# Patient Record
Sex: Female | Born: 1955 | Race: White | Hispanic: No | Marital: Married | Smoking: Never smoker
Health system: Southern US, Community
[De-identification: ages and names within clinical notes are randomized; demographics above are authoritative.]

## PROBLEM LIST (undated history)

## (undated) DIAGNOSIS — T4145XA Adverse effect of unspecified anesthetic, initial encounter: Secondary | ICD-10-CM

## (undated) DIAGNOSIS — M179 Osteoarthritis of knee, unspecified: Secondary | ICD-10-CM

## (undated) DIAGNOSIS — E785 Hyperlipidemia, unspecified: Secondary | ICD-10-CM

## (undated) DIAGNOSIS — M1990 Unspecified osteoarthritis, unspecified site: Secondary | ICD-10-CM

## (undated) DIAGNOSIS — Z8669 Personal history of other diseases of the nervous system and sense organs: Secondary | ICD-10-CM

## (undated) DIAGNOSIS — K219 Gastro-esophageal reflux disease without esophagitis: Secondary | ICD-10-CM

## (undated) DIAGNOSIS — Z8489 Family history of other specified conditions: Secondary | ICD-10-CM

## (undated) DIAGNOSIS — I82409 Acute embolism and thrombosis of unspecified deep veins of unspecified lower extremity: Secondary | ICD-10-CM

## (undated) DIAGNOSIS — J45909 Unspecified asthma, uncomplicated: Secondary | ICD-10-CM

## (undated) DIAGNOSIS — E039 Hypothyroidism, unspecified: Secondary | ICD-10-CM

## (undated) DIAGNOSIS — R739 Hyperglycemia, unspecified: Secondary | ICD-10-CM

## (undated) DIAGNOSIS — F32A Depression, unspecified: Secondary | ICD-10-CM

## (undated) DIAGNOSIS — M5440 Lumbago with sciatica, unspecified side: Secondary | ICD-10-CM

## (undated) DIAGNOSIS — R32 Unspecified urinary incontinence: Secondary | ICD-10-CM

## (undated) DIAGNOSIS — R160 Hepatomegaly, not elsewhere classified: Secondary | ICD-10-CM

## (undated) DIAGNOSIS — I10 Essential (primary) hypertension: Secondary | ICD-10-CM

## (undated) DIAGNOSIS — R632 Polyphagia: Secondary | ICD-10-CM

## (undated) DIAGNOSIS — K760 Fatty (change of) liver, not elsewhere classified: Secondary | ICD-10-CM

## (undated) DIAGNOSIS — M1710 Unilateral primary osteoarthritis, unspecified knee: Secondary | ICD-10-CM

## (undated) DIAGNOSIS — G4730 Sleep apnea, unspecified: Secondary | ICD-10-CM

## (undated) HISTORY — DX: Gastro-esophageal reflux disease without esophagitis: K21.9

## (undated) HISTORY — PX: OTHER SURGICAL HISTORY: SHX169

## (undated) HISTORY — DX: Polyphagia: R63.2

## (undated) HISTORY — DX: Unilateral primary osteoarthritis, unspecified knee: M17.10

## (undated) HISTORY — DX: Acute embolism and thrombosis of unspecified deep veins of unspecified lower extremity: I82.409

## (undated) HISTORY — DX: Lumbago with sciatica, unspecified side: M54.40

## (undated) HISTORY — DX: Fatty (change of) liver, not elsewhere classified: K76.0

## (undated) HISTORY — DX: Hyperglycemia, unspecified: R73.9

## (undated) HISTORY — DX: Depression, unspecified: F32.A

## (undated) HISTORY — DX: Hepatomegaly, not elsewhere classified: R16.0

## (undated) HISTORY — DX: Hyperlipidemia, unspecified: E78.5

## (undated) HISTORY — DX: Personal history of other diseases of the nervous system and sense organs: Z86.69

## (undated) HISTORY — PX: CHOLECYSTECTOMY: SHX55

## (undated) HISTORY — DX: Hypercalcemia: E83.52

## (undated) HISTORY — DX: Osteoarthritis of knee, unspecified: M17.9

---

## 2004-04-19 ENCOUNTER — Ambulatory Visit: Payer: Self-pay | Admitting: Family Medicine

## 2004-04-20 ENCOUNTER — Inpatient Hospital Stay: Payer: Self-pay | Admitting: Internal Medicine

## 2004-06-15 ENCOUNTER — Ambulatory Visit (HOSPITAL_COMMUNITY): Admission: RE | Admit: 2004-06-15 | Discharge: 2004-06-15 | Payer: Self-pay | Admitting: General Surgery

## 2004-06-23 ENCOUNTER — Ambulatory Visit: Payer: Self-pay | Admitting: Family Medicine

## 2004-06-23 ENCOUNTER — Emergency Department: Payer: Self-pay | Admitting: Emergency Medicine

## 2004-07-05 ENCOUNTER — Ambulatory Visit (HOSPITAL_COMMUNITY): Admission: RE | Admit: 2004-07-05 | Discharge: 2004-07-05 | Payer: Self-pay | Admitting: Orthopedic Surgery

## 2007-03-24 ENCOUNTER — Ambulatory Visit: Payer: Self-pay | Admitting: Unknown Physician Specialty

## 2007-03-30 ENCOUNTER — Ambulatory Visit: Payer: Self-pay | Admitting: Unknown Physician Specialty

## 2008-07-28 ENCOUNTER — Ambulatory Visit: Payer: Self-pay | Admitting: Urology

## 2008-08-23 ENCOUNTER — Ambulatory Visit: Payer: Self-pay | Admitting: Unknown Physician Specialty

## 2008-09-21 ENCOUNTER — Ambulatory Visit: Payer: Self-pay | Admitting: Unknown Physician Specialty

## 2008-09-22 ENCOUNTER — Ambulatory Visit: Payer: Self-pay | Admitting: Unknown Physician Specialty

## 2011-12-19 ENCOUNTER — Other Ambulatory Visit (HOSPITAL_COMMUNITY): Payer: Self-pay | Admitting: Orthopedic Surgery

## 2012-02-10 ENCOUNTER — Encounter (HOSPITAL_COMMUNITY)
Admission: RE | Admit: 2012-02-10 | Discharge: 2012-02-10 | Disposition: A | Discharge status: Home / Self Care | Payer: BC Managed Care – PPO | Source: Ambulatory Visit | Attending: Orthopedic Surgery | Admitting: Orthopedic Surgery

## 2012-02-10 ENCOUNTER — Encounter (HOSPITAL_COMMUNITY): Payer: Self-pay

## 2012-02-10 DIAGNOSIS — Z01812 Encounter for preprocedural laboratory examination: Secondary | ICD-10-CM | POA: Insufficient documentation

## 2012-02-10 DIAGNOSIS — Z01818 Encounter for other preprocedural examination: Secondary | ICD-10-CM | POA: Insufficient documentation

## 2012-02-10 DIAGNOSIS — Z01810 Encounter for preprocedural cardiovascular examination: Secondary | ICD-10-CM | POA: Insufficient documentation

## 2012-02-10 HISTORY — DX: Sleep apnea, unspecified: G47.30

## 2012-02-10 HISTORY — DX: Hypothyroidism, unspecified: E03.9

## 2012-02-10 HISTORY — DX: Unspecified asthma, uncomplicated: J45.909

## 2012-02-10 HISTORY — DX: Unspecified osteoarthritis, unspecified site: M19.90

## 2012-02-10 LAB — BASIC METABOLIC PANEL
Creatinine, Ser: 0.83 mg/dL (ref 0.50–1.10)
GFR calc Af Amer: 88 mL/min — ABNORMAL LOW (ref >90)
GFR calc non Af Amer: 76 mL/min — ABNORMAL LOW (ref >90)
Potassium: 4.2 mEq/L (ref 3.5–5.1)

## 2012-02-10 LAB — URINE MICROSCOPIC-ADD ON

## 2012-02-10 LAB — URINALYSIS, ROUTINE W REFLEX MICROSCOPIC
Ketones, ur: NEGATIVE mg/dL
Nitrite: POSITIVE — AB
Protein, ur: NEGATIVE mg/dL

## 2012-02-10 LAB — CBC
HCT: 40.1 % (ref 36.0–46.0)
Hemoglobin: 13.4 g/dL (ref 12.0–15.0)
MCH: 29.6 pg (ref 26.0–34.0)
Platelets: 293 10*3/uL (ref 150–400)
RDW: 13.4 % (ref 11.5–15.5)
WBC: 8.1 10*3/uL (ref 4.0–10.5)

## 2012-02-10 LAB — TYPE AND SCREEN: ABO/RH(D): B POS

## 2012-02-10 LAB — SURGICAL PCR SCREEN: Staphylococcus aureus: NEGATIVE

## 2012-02-10 NOTE — Pre-Procedure Instructions (Signed)
20 Mercedes Carter  02/10/2012   Your procedure is scheduled on:  02-18-2012  Report to [facility] at 5:30 AM.  Call this number if you have problems the morning of surgery: [phone]   Remember:   Do not eat food or drink:After Midnight..    Take these medicines the morning of surgery with A SIP OF WATER:    Do not wear jewelry, make-up or nail polish.  Do not wear lotions, powders, or perfumes. You may not wear deodorant.  Do not shave 48 hours prior to surgery. .  Do not bring valuables to the hospital.  Contacts, dentures or bridgework may not be worn into surgery.  Leave suitcase in the car. After surgery it may be brought to your room.    For patients admitted to the hospital, checkout time is 11:00 AM the day of discharge.  Marland Kitchen   Special Instructions: Shower using CHG 2 nights before surgery and the night before surgery.  If you shower the day of surgery use CHG.  Use special wash - you have one bottle of CHG for all showers.  You should use approximately 1/3 of the bottle for each shower.     Please read over the following fact sheets that you were given: Pain Booklet, Coughing and Deep Breathing, Blood Transfusion Information, MRSA Information and Surgical Site Infection Prevention

## 2012-02-10 NOTE — Progress Notes (Signed)
Sleep study requested from [facility] ,[place], [place].

## 2012-02-10 NOTE — Progress Notes (Signed)
Need to have a release form sighned to get sleep study from [facility]

## 2012-02-13 LAB — URINE CULTURE

## 2012-02-17 MED ORDER — DEXTROSE 5 % IV SOLN
3.0000 g | INTRAVENOUS | Status: AC
  Administered 2012-02-18: 3 g via INTRAVENOUS
  Filled 2012-02-17: qty 3000

## 2012-02-18 ENCOUNTER — Encounter (HOSPITAL_COMMUNITY): Payer: Self-pay

## 2012-02-18 ENCOUNTER — Inpatient Hospital Stay (HOSPITAL_COMMUNITY): Payer: BC Managed Care – PPO

## 2012-02-18 ENCOUNTER — Inpatient Hospital Stay (HOSPITAL_COMMUNITY): Payer: BC Managed Care – PPO | Admitting: Anesthesiology

## 2012-02-18 ENCOUNTER — Encounter (HOSPITAL_COMMUNITY): Payer: Self-pay | Admitting: Anesthesiology

## 2012-02-18 ENCOUNTER — Encounter (HOSPITAL_COMMUNITY)
Admission: RE | Disposition: A | Discharge status: Home Health | Payer: Self-pay | Source: Ambulatory Visit | Attending: Orthopedic Surgery

## 2012-02-18 ENCOUNTER — Inpatient Hospital Stay (HOSPITAL_COMMUNITY)
Admission: RE | Admit: 2012-02-18 | Discharge: 2012-02-23 | DRG: 209 | Disposition: A | Discharge status: Home Health | Payer: BC Managed Care – PPO | Source: Ambulatory Visit | Attending: Orthopedic Surgery | Admitting: Orthopedic Surgery

## 2012-02-18 DIAGNOSIS — E039 Hypothyroidism, unspecified: Secondary | ICD-10-CM | POA: Diagnosis present

## 2012-02-18 DIAGNOSIS — J45909 Unspecified asthma, uncomplicated: Secondary | ICD-10-CM | POA: Diagnosis present

## 2012-02-18 DIAGNOSIS — R0689 Other abnormalities of breathing: Secondary | ICD-10-CM

## 2012-02-18 DIAGNOSIS — G4733 Obstructive sleep apnea (adult) (pediatric): Secondary | ICD-10-CM | POA: Diagnosis present

## 2012-02-18 DIAGNOSIS — J9601 Acute respiratory failure with hypoxia: Secondary | ICD-10-CM

## 2012-02-18 DIAGNOSIS — M1710 Unilateral primary osteoarthritis, unspecified knee: Principal | ICD-10-CM | POA: Diagnosis present

## 2012-02-18 DIAGNOSIS — R4182 Altered mental status, unspecified: Secondary | ICD-10-CM

## 2012-02-18 DIAGNOSIS — M129 Arthropathy, unspecified: Secondary | ICD-10-CM | POA: Diagnosis present

## 2012-02-18 DIAGNOSIS — Z91199 Patient's noncompliance with other medical treatment and regimen due to unspecified reason: Secondary | ICD-10-CM

## 2012-02-18 DIAGNOSIS — T8859XA Other complications of anesthesia, initial encounter: Secondary | ICD-10-CM | POA: Insufficient documentation

## 2012-02-18 DIAGNOSIS — Z6841 Body Mass Index (BMI) 40.0 and over, adult: Secondary | ICD-10-CM

## 2012-02-18 DIAGNOSIS — E669 Obesity, unspecified: Secondary | ICD-10-CM | POA: Diagnosis present

## 2012-02-18 DIAGNOSIS — Z9119 Patient's noncompliance with other medical treatment and regimen: Secondary | ICD-10-CM

## 2012-02-18 DIAGNOSIS — J95821 Acute postprocedural respiratory failure: Secondary | ICD-10-CM

## 2012-02-18 DIAGNOSIS — R0609 Other forms of dyspnea: Secondary | ICD-10-CM

## 2012-02-18 DIAGNOSIS — I10 Essential (primary) hypertension: Secondary | ICD-10-CM | POA: Diagnosis present

## 2012-02-18 DIAGNOSIS — Z96659 Presence of unspecified artificial knee joint: Secondary | ICD-10-CM

## 2012-02-18 DIAGNOSIS — K219 Gastro-esophageal reflux disease without esophagitis: Secondary | ICD-10-CM | POA: Diagnosis present

## 2012-02-18 HISTORY — PX: TOTAL KNEE ARTHROPLASTY: SHX125

## 2012-02-18 HISTORY — DX: Acute respiratory failure with hypoxia: J96.01

## 2012-02-18 HISTORY — DX: Family history of other specified conditions: Z84.89

## 2012-02-18 HISTORY — DX: Adverse effect of unspecified anesthetic, initial encounter: T41.45XA

## 2012-02-18 HISTORY — DX: Essential (primary) hypertension: I10

## 2012-02-18 HISTORY — DX: Presence of unspecified artificial knee joint: Z96.659

## 2012-02-18 HISTORY — DX: Obstructive sleep apnea (adult) (pediatric): G47.33

## 2012-02-18 HISTORY — DX: Acute postprocedural respiratory failure: J95.821

## 2012-02-18 HISTORY — DX: Other abnormalities of breathing: R06.89

## 2012-02-18 HISTORY — DX: Obesity, unspecified: E66.9

## 2012-02-18 HISTORY — DX: Other complications of anesthesia, initial encounter: T88.59XA

## 2012-02-18 HISTORY — DX: Unspecified urinary incontinence: R32

## 2012-02-18 HISTORY — DX: Altered mental status, unspecified: R41.82

## 2012-02-18 HISTORY — DX: Hypothyroidism, unspecified: E03.9

## 2012-02-18 LAB — CK TOTAL AND CKMB (NOT AT ARMC)
CK, MB: 6 ng/mL — ABNORMAL HIGH (ref 0.3–4.0)
Relative Index: 1.3 (ref 0.0–2.5)
Total CK: 470 U/L — ABNORMAL HIGH (ref 7–177)

## 2012-02-18 LAB — COMPREHENSIVE METABOLIC PANEL
AST: 65 U/L — ABNORMAL HIGH (ref 0–37)
Albumin: 3.5 g/dL (ref 3.5–5.2)
BUN: 16 mg/dL (ref 6–23)
Calcium: 8.7 mg/dL (ref 8.4–10.5)
Creatinine, Ser: 0.99 mg/dL (ref 0.50–1.10)
Total Bilirubin: 0.2 mg/dL — ABNORMAL LOW (ref 0.3–1.2)
Total Protein: 7 g/dL (ref 6.0–8.3)

## 2012-02-18 LAB — APTT: aPTT: 28 seconds (ref 24–37)

## 2012-02-18 LAB — PROTIME-INR: Prothrombin Time: 12.9 seconds (ref 11.6–15.2)

## 2012-02-18 LAB — GLUCOSE, CAPILLARY
Glucose-Capillary: 404 mg/dL — ABNORMAL HIGH (ref 70–99)
Glucose-Capillary: 357 mg/dL — ABNORMAL HIGH (ref 70–99)

## 2012-02-18 LAB — CBC
Hemoglobin: 11.6 g/dL — ABNORMAL LOW (ref 12.0–15.0)
MCH: 28.6 pg (ref 26.0–34.0)
Platelets: 325 10*3/uL (ref 150–400)
RBC: 4.05 MIL/uL (ref 3.87–5.11)
WBC: 19.9 10*3/uL — ABNORMAL HIGH (ref 4.0–10.5)

## 2012-02-18 LAB — BLOOD GAS, ARTERIAL
Acid-base deficit: 7.9 mmol/L — ABNORMAL HIGH (ref 0.0–2.0)
Drawn by: 277331
O2 Content: 5 L/min
pCO2 arterial: 43.6 mmHg (ref 35.0–45.0)
pH, Arterial: 7.243 — ABNORMAL LOW (ref 7.350–7.450)
pO2, Arterial: 152 mmHg — ABNORMAL HIGH (ref 80.0–100.0)

## 2012-02-18 LAB — LACTIC ACID, PLASMA: Lactic Acid, Venous: 5.3 mmol/L — ABNORMAL HIGH (ref 0.5–2.2)

## 2012-02-18 SURGERY — ARTHROPLASTY, KNEE, TOTAL
Anesthesia: General | Site: Knee | Laterality: Right | Wound class: Clean

## 2012-02-18 MED ORDER — CLONIDINE HCL (ANALGESIA) 100 MCG/ML EP SOLN
EPIDURAL | Status: DC | PRN
  Administered 2012-02-18: 1 mL

## 2012-02-18 MED ORDER — GLYCOPYRROLATE 0.2 MG/ML IJ SOLN
INTRAMUSCULAR | Status: DC | PRN
  Administered 2012-02-18: 0.6 mg via INTRAVENOUS

## 2012-02-18 MED ORDER — METHOCARBAMOL 100 MG/ML IJ SOLN
500.0000 mg | Freq: Four times a day (QID) | INTRAVENOUS | Status: DC | PRN
  Administered 2012-02-18: 500 mg via INTRAVENOUS
  Filled 2012-02-18: qty 5

## 2012-02-18 MED ORDER — VECURONIUM BROMIDE 10 MG IV SOLR
INTRAVENOUS | Status: DC | PRN
  Administered 2012-02-18: 1 mg via INTRAVENOUS
  Administered 2012-02-18: 2 mg via INTRAVENOUS
  Administered 2012-02-18: 1 mg via INTRAVENOUS
  Administered 2012-02-18: 5 mg via INTRAVENOUS

## 2012-02-18 MED ORDER — BUPIVACAINE-EPINEPHRINE (PF) 0.5% -1:200000 IJ SOLN
INTRAMUSCULAR | Status: AC
  Filled 2012-02-18: qty 10

## 2012-02-18 MED ORDER — WARFARIN SODIUM 7.5 MG PO TABS
7.5000 mg | ORAL_TABLET | Freq: Once | ORAL | Status: AC
  Administered 2012-02-18: 7.5 mg via ORAL
  Filled 2012-02-18 (×2): qty 1

## 2012-02-18 MED ORDER — BUPIVACAINE-EPINEPHRINE 0.5% -1:200000 IJ SOLN
INTRAMUSCULAR | Status: DC | PRN
  Administered 2012-02-18: 30 mL

## 2012-02-18 MED ORDER — ONDANSETRON HCL 4 MG/2ML IJ SOLN
INTRAMUSCULAR | Status: DC | PRN
  Administered 2012-02-18: 4 mg via INTRAVENOUS

## 2012-02-18 MED ORDER — PHENOL 1.4 % MT LIQD: 1.0000 | OROMUCOSAL | Status: DC | PRN

## 2012-02-18 MED ORDER — NALOXONE HCL 0.4 MG/ML IJ SOLN
0.4000 mg | INTRAMUSCULAR | Status: DC | PRN
  Administered 2012-02-18 (×2): 0.2 mg via INTRAVENOUS
  Filled 2012-02-18 (×3): qty 1

## 2012-02-18 MED ORDER — MEPERIDINE HCL 25 MG/ML IJ SOLN: 6.2500 mg | INTRAMUSCULAR | Status: DC | PRN

## 2012-02-18 MED ORDER — KETOROLAC TROMETHAMINE 30 MG/ML IJ SOLN
15.0000 mg | Freq: Once | INTRAMUSCULAR | Status: AC | PRN
  Administered 2012-02-18: 30 mg via INTRAVENOUS

## 2012-02-18 MED ORDER — KETOROLAC TROMETHAMINE 30 MG/ML IJ SOLN
INTRAMUSCULAR | Status: AC
  Filled 2012-02-18: qty 1

## 2012-02-18 MED ORDER — ACETAMINOPHEN 10 MG/ML IV SOLN
1000.0000 mg | Freq: Once | INTRAVENOUS | Status: AC
  Administered 2012-02-18: 1000 mg via INTRAVENOUS
  Filled 2012-02-18: qty 100

## 2012-02-18 MED ORDER — OXYCODONE HCL 5 MG PO TABS: 5.0000 mg | ORAL_TABLET | Freq: Once | ORAL | Status: DC | PRN

## 2012-02-18 MED ORDER — ONDANSETRON HCL 4 MG PO TABS: 4.0000 mg | ORAL_TABLET | Freq: Four times a day (QID) | ORAL | Status: DC | PRN

## 2012-02-18 MED ORDER — HYDROMORPHONE HCL PF 1 MG/ML IJ SOLN
0.2500 mg | INTRAMUSCULAR | Status: DC | PRN
  Administered 2012-02-18 (×4): 0.5 mg via INTRAVENOUS

## 2012-02-18 MED ORDER — LEVOTHYROXINE SODIUM 175 MCG PO TABS
175.0000 ug | ORAL_TABLET | Freq: Every day | ORAL | Status: DC
  Administered 2012-02-19 – 2012-02-23 (×5): 175 ug via ORAL
  Filled 2012-02-18 (×8): qty 1

## 2012-02-18 MED ORDER — METHOCARBAMOL 500 MG PO TABS
500.0000 mg | ORAL_TABLET | Freq: Four times a day (QID) | ORAL | Status: DC | PRN
  Administered 2012-02-19 – 2012-02-22 (×6): 500 mg via ORAL
  Filled 2012-02-18 (×6): qty 1

## 2012-02-18 MED ORDER — HYDROMORPHONE HCL PF 1 MG/ML IJ SOLN
INTRAMUSCULAR | Status: AC
  Administered 2012-02-18: 0.5 mg via INTRAVENOUS
  Filled 2012-02-18: qty 1

## 2012-02-18 MED ORDER — MORPHINE SULFATE 2 MG/ML IJ SOLN
0.5000 mg | INTRAMUSCULAR | Status: DC | PRN
  Administered 2012-02-19 (×4): 0.5 mg via INTRAVENOUS
  Filled 2012-02-18 (×4): qty 1

## 2012-02-18 MED ORDER — ACETAMINOPHEN 10 MG/ML IV SOLN
INTRAVENOUS | Status: AC
  Filled 2012-02-18: qty 100

## 2012-02-18 MED ORDER — MORPHINE SULFATE (PF) 1 MG/ML IV SOLN
INTRAVENOUS | Status: DC
  Administered 2012-02-18: 11:00:00 via INTRAVENOUS

## 2012-02-18 MED ORDER — HYDROMORPHONE HCL PF 1 MG/ML IJ SOLN
INTRAMUSCULAR | Status: AC
  Filled 2012-02-18: qty 1

## 2012-02-18 MED ORDER — PROPOFOL 10 MG/ML IV BOLUS
INTRAVENOUS | Status: DC | PRN
  Administered 2012-02-18: 200 mg via INTRAVENOUS

## 2012-02-18 MED ORDER — DOCUSATE SODIUM 100 MG PO CAPS
100.0000 mg | ORAL_CAPSULE | Freq: Two times a day (BID) | ORAL | Status: DC
  Administered 2012-02-19 – 2012-02-23 (×8): 100 mg via ORAL
  Filled 2012-02-18 (×12): qty 1

## 2012-02-18 MED ORDER — WARFARIN VIDEO: Freq: Once | Status: DC

## 2012-02-18 MED ORDER — ALBUTEROL SULFATE HFA 108 (90 BASE) MCG/ACT IN AERS: 2.0000 | INHALATION_SPRAY | Freq: Four times a day (QID) | RESPIRATORY_TRACT | Status: DC | PRN

## 2012-02-18 MED ORDER — DIPHENHYDRAMINE HCL 50 MG/ML IJ SOLN
12.5000 mg | Freq: Four times a day (QID) | INTRAMUSCULAR | Status: DC | PRN
  Administered 2012-02-18: 12.5 mg via INTRAVENOUS
  Filled 2012-02-18: qty 1

## 2012-02-18 MED ORDER — ONDANSETRON HCL 4 MG/2ML IJ SOLN: 4.0000 mg | Freq: Four times a day (QID) | INTRAMUSCULAR | Status: DC | PRN

## 2012-02-18 MED ORDER — ARTIFICIAL TEARS OP OINT
TOPICAL_OINTMENT | OPHTHALMIC | Status: DC | PRN
  Administered 2012-02-18: 1 via OPHTHALMIC

## 2012-02-18 MED ORDER — METOCLOPRAMIDE HCL 10 MG PO TABS: 5.0000 mg | ORAL_TABLET | Freq: Three times a day (TID) | ORAL | Status: DC | PRN

## 2012-02-18 MED ORDER — FENTANYL CITRATE 0.05 MG/ML IJ SOLN
INTRAMUSCULAR | Status: DC | PRN
  Administered 2012-02-18: 50 ug via INTRAVENOUS
  Administered 2012-02-18: 100 ug via INTRAVENOUS
  Administered 2012-02-18 (×7): 50 ug via INTRAVENOUS

## 2012-02-18 MED ORDER — LACTATED RINGERS IV SOLN
INTRAVENOUS | Status: DC | PRN
  Administered 2012-02-18 (×2): via INTRAVENOUS

## 2012-02-18 MED ORDER — CLONIDINE HCL (ANALGESIA) 100 MCG/ML EP SOLN
150.0000 ug | Freq: Once | EPIDURAL | Status: DC
  Filled 2012-02-18: qty 1.5

## 2012-02-18 MED ORDER — OXYCODONE HCL 5 MG/5ML PO SOLN: 5.0000 mg | Freq: Once | ORAL | Status: DC | PRN

## 2012-02-18 MED ORDER — NEOSTIGMINE METHYLSULFATE 1 MG/ML IJ SOLN
INTRAMUSCULAR | Status: DC | PRN
  Administered 2012-02-18: 5 mg via INTRAVENOUS

## 2012-02-18 MED ORDER — MORPHINE SULFATE 4 MG/ML IJ SOLN
INTRAMUSCULAR | Status: AC
  Filled 2012-02-18: qty 1

## 2012-02-18 MED ORDER — SUCCINYLCHOLINE CHLORIDE 20 MG/ML IJ SOLN
INTRAMUSCULAR | Status: DC | PRN
  Administered 2012-02-18: 100 mg via INTRAVENOUS

## 2012-02-18 MED ORDER — LIDOCAINE HCL (CARDIAC) 20 MG/ML IV SOLN
INTRAVENOUS | Status: DC | PRN
  Administered 2012-02-18: 70 mg via INTRAVENOUS

## 2012-02-18 MED ORDER — COUMADIN BOOK
Freq: Once | Status: DC
  Filled 2012-02-18: qty 1

## 2012-02-18 MED ORDER — ACETAMINOPHEN 325 MG PO TABS
650.0000 mg | ORAL_TABLET | Freq: Four times a day (QID) | ORAL | Status: DC | PRN
  Administered 2012-02-19 – 2012-02-22 (×5): 650 mg via ORAL
  Administered 2012-02-22: 325 mg via ORAL
  Filled 2012-02-18 (×3): qty 2
  Filled 2012-02-18: qty 1
  Filled 2012-02-18 (×3): qty 2

## 2012-02-18 MED ORDER — SODIUM CHLORIDE 0.9 % IJ SOLN: 9.0000 mL | INTRAMUSCULAR | Status: DC | PRN

## 2012-02-18 MED ORDER — ACETAMINOPHEN 650 MG RE SUPP: 650.0000 mg | Freq: Four times a day (QID) | RECTAL | Status: DC | PRN

## 2012-02-18 MED ORDER — METOCLOPRAMIDE HCL 5 MG/ML IJ SOLN
5.0000 mg | Freq: Three times a day (TID) | INTRAMUSCULAR | Status: DC | PRN
Start: 2012-02-18 — End: 2012-02-23
  Filled 2012-02-18 (×2): qty 2

## 2012-02-18 MED ORDER — MORPHINE SULFATE 4 MG/ML IJ SOLN
INTRAMUSCULAR | Status: DC | PRN
  Administered 2012-02-18: 8 mg via INTRAMUSCULAR

## 2012-02-18 MED ORDER — LABETALOL HCL 5 MG/ML IV SOLN
INTRAVENOUS | Status: DC | PRN
  Administered 2012-02-18: 5 mg via INTRAVENOUS

## 2012-02-18 MED ORDER — CEFAZOLIN SODIUM 1-5 GM-% IV SOLN
1.0000 g | Freq: Three times a day (TID) | INTRAVENOUS | Status: AC
  Administered 2012-02-19: 1 g via INTRAVENOUS
  Filled 2012-02-18 (×4): qty 50

## 2012-02-18 MED ORDER — PROMETHAZINE HCL 25 MG/ML IJ SOLN: 6.2500 mg | INTRAMUSCULAR | Status: DC | PRN

## 2012-02-18 MED ORDER — MORPHINE SULFATE 2 MG/ML IJ SOLN
INTRAMUSCULAR | Status: AC
  Filled 2012-02-18: qty 2

## 2012-02-18 MED ORDER — MENTHOL 3 MG MT LOZG: 1.0000 | LOZENGE | OROMUCOSAL | Status: DC | PRN

## 2012-02-18 MED ORDER — MIDAZOLAM HCL 5 MG/5ML IJ SOLN
INTRAMUSCULAR | Status: DC | PRN
  Administered 2012-02-18: 2 mg via INTRAVENOUS

## 2012-02-18 MED ORDER — MORPHINE SULFATE (PF) 1 MG/ML IV SOLN
INTRAVENOUS | Status: AC
  Filled 2012-02-18: qty 25

## 2012-02-18 MED ORDER — DIPHENHYDRAMINE HCL 12.5 MG/5ML PO ELIX
12.5000 mg | ORAL_SOLUTION | Freq: Four times a day (QID) | ORAL | Status: DC | PRN
  Filled 2012-02-18: qty 5

## 2012-02-18 MED ORDER — WARFARIN - PHARMACIST DOSING INPATIENT
Freq: Every day | Status: DC
  Administered 2012-02-18: 20:00:00

## 2012-02-18 MED ORDER — POTASSIUM CHLORIDE IN NACL 20-0.45 MEQ/L-% IV SOLN
INTRAVENOUS | Status: DC
  Administered 2012-02-18: 19:00:00 via INTRAVENOUS
  Filled 2012-02-18 (×5): qty 1000

## 2012-02-18 MED ORDER — HYDROCODONE-ACETAMINOPHEN 7.5-325 MG PO TABS
1.0000 | ORAL_TABLET | Freq: Four times a day (QID) | ORAL | Status: DC
  Administered 2012-02-19 – 2012-02-23 (×15): 1 via ORAL
  Filled 2012-02-18 (×15): qty 1

## 2012-02-18 MED ORDER — POTASSIUM CHLORIDE IN NACL 20-0.9 MEQ/L-% IV SOLN
INTRAVENOUS | Status: DC
  Filled 2012-02-18 (×2): qty 1000

## 2012-02-18 SURGICAL SUPPLY — 79 items
BANDAGE ELASTIC 4 VELCRO ST LF (GAUZE/BANDAGES/DRESSINGS) ×2 IMPLANT
BANDAGE ELASTIC 6 VELCRO ST LF (GAUZE/BANDAGES/DRESSINGS) ×1 IMPLANT
BANDAGE ESMARK 6X9 LF (GAUZE/BANDAGES/DRESSINGS) ×1 IMPLANT
BLADE SAG 18X100X1.27 (BLADE) ×2 IMPLANT
BLADE SAW SGTL 13.0X1.19X90.0M (BLADE) ×2 IMPLANT
BNDG CMPR 9X6 STRL LF SNTH (GAUZE/BANDAGES/DRESSINGS) ×1
BNDG CMPR MED 10X6 ELC LF (GAUZE/BANDAGES/DRESSINGS) ×1
BNDG COHESIVE 6X5 TAN STRL LF (GAUZE/BANDAGES/DRESSINGS) ×2 IMPLANT
BNDG ELASTIC 6X10 VLCR STRL LF (GAUZE/BANDAGES/DRESSINGS) ×4 IMPLANT
BNDG ESMARK 6X9 LF (GAUZE/BANDAGES/DRESSINGS) ×2
BOWL SMART MIX CTS (DISPOSABLE) ×2 IMPLANT
CEMENT BONE SIMPLEX SPEEDSET (Cement) ×2 IMPLANT
CLOTH BEACON ORANGE TIMEOUT ST (SAFETY) ×2 IMPLANT
COVER BACK TABLE 24X17X13 BIG (DRAPES) IMPLANT
COVER SURGICAL LIGHT HANDLE (MISCELLANEOUS) ×2 IMPLANT
CUFF TOURNIQUET SINGLE 34IN LL (TOURNIQUET CUFF) IMPLANT
CUFF TOURNIQUET SINGLE 44IN (TOURNIQUET CUFF) IMPLANT
DRAPE INCISE IOBAN 66X45 STRL (DRAPES) ×1 IMPLANT
DRAPE ORTHO SPLIT 77X108 STRL (DRAPES) ×4
DRAPE PROXIMA HALF (DRAPES) ×2 IMPLANT
DRAPE SURG ORHT 6 SPLT 77X108 (DRAPES) ×2 IMPLANT
DRAPE U-SHAPE 47X51 STRL (DRAPES) ×2 IMPLANT
DRAPE X-RAY CASS 24X20 (DRAPES) IMPLANT
DRSG PAD ABDOMINAL 8X10 ST (GAUZE/BANDAGES/DRESSINGS) ×3 IMPLANT
DURAPREP 26ML APPLICATOR (WOUND CARE) ×4 IMPLANT
ELECT REM PT RETURN 9FT ADLT (ELECTROSURGICAL) ×2
ELECTRODE REM PT RTRN 9FT ADLT (ELECTROSURGICAL) ×1 IMPLANT
EVACUATOR 1/8 PVC DRAIN (DRAIN) ×1 IMPLANT
FACESHIELD LNG OPTICON STERILE (SAFETY) ×2 IMPLANT
GAUZE XEROFORM 5X9 LF (GAUZE/BANDAGES/DRESSINGS) ×2 IMPLANT
GLOVE BIO SURGEON ST LM GN SZ9 (GLOVE) ×2 IMPLANT
GLOVE BIOGEL PI IND STRL 8 (GLOVE) ×1 IMPLANT
GLOVE BIOGEL PI INDICATOR 8 (GLOVE) ×1
GLOVE SURG ORTHO 8.0 STRL STRW (GLOVE) ×2 IMPLANT
GOWN PREVENTION PLUS LG XLONG (DISPOSABLE) IMPLANT
GOWN PREVENTION PLUS XLARGE (GOWN DISPOSABLE) ×2 IMPLANT
GOWN STRL NON-REIN LRG LVL3 (GOWN DISPOSABLE) ×6 IMPLANT
HANDPIECE INTERPULSE COAX TIP (DISPOSABLE) ×2
HOOD PEEL AWAY FACE SHEILD DIS (HOOD) ×6 IMPLANT
IMMOBILIZER KNEE 20 (SOFTGOODS)
IMMOBILIZER KNEE 20 THIGH 36 (SOFTGOODS) IMPLANT
IMMOBILIZER KNEE 22 UNIV (SOFTGOODS) IMPLANT
IMMOBILIZER KNEE 24 THIGH 36 (MISCELLANEOUS) IMPLANT
IMMOBILIZER KNEE 24 UNIV (MISCELLANEOUS)
KIT BASIN OR (CUSTOM PROCEDURE TRAY) ×2 IMPLANT
KIT ROOM TURNOVER OR (KITS) ×2 IMPLANT
MANIFOLD NEPTUNE II (INSTRUMENTS) ×2 IMPLANT
MARKER SPHERE PSV REFLC THRD 5 (MARKER) ×6 IMPLANT
NDL 18GX1X1/2 (RX/OR ONLY) (NEEDLE) ×1 IMPLANT
NDL SPNL 18GX3.5 QUINCKE PK (NEEDLE) ×1 IMPLANT
NEEDLE 18GX1X1/2 (RX/OR ONLY) (NEEDLE) ×2 IMPLANT
NEEDLE SPNL 18GX3.5 QUINCKE PK (NEEDLE) ×2 IMPLANT
NS IRRIG 1000ML POUR BTL (IV SOLUTION) ×2 IMPLANT
PACK TOTAL JOINT (CUSTOM PROCEDURE TRAY) ×2 IMPLANT
PAD ARMBOARD 7.5X6 YLW CONV (MISCELLANEOUS) ×4 IMPLANT
PAD CAST 4YDX4 CTTN HI CHSV (CAST SUPPLIES) ×1 IMPLANT
PADDING CAST ABS 6INX4YD NS (CAST SUPPLIES) ×1
PADDING CAST ABS COTTON 6X4 NS (CAST SUPPLIES) IMPLANT
PADDING CAST COTTON 4X4 STRL (CAST SUPPLIES) ×2
PADDING CAST COTTON 6X4 STRL (CAST SUPPLIES) ×6 IMPLANT
PIN SCHANZ 4MM 130MM (PIN) ×4 IMPLANT
RUBBERBAND STERILE (MISCELLANEOUS) ×2 IMPLANT
SET HNDPC FAN SPRY TIP SCT (DISPOSABLE) ×1 IMPLANT
SPONGE GAUZE 4X4 12PLY (GAUZE/BANDAGES/DRESSINGS) ×2 IMPLANT
SPONGE LAP 18X18 X RAY DECT (DISPOSABLE) ×1 IMPLANT
STAPLER VISISTAT 35W (STAPLE) ×2 IMPLANT
SUCTION FRAZIER TIP 10 FR DISP (SUCTIONS) ×2 IMPLANT
SUT ETHILON 3 0 PS 1 (SUTURE) ×4 IMPLANT
SUT VIC AB 0 CTB1 27 (SUTURE) ×6 IMPLANT
SUT VIC AB 1 CT1 27 (SUTURE) ×10
SUT VIC AB 1 CT1 27XBRD ANBCTR (SUTURE) ×5 IMPLANT
SUT VIC AB 2-0 CT1 27 (SUTURE) ×4
SUT VIC AB 2-0 CT1 TAPERPNT 27 (SUTURE) ×2 IMPLANT
SYR 30ML SLIP (SYRINGE) ×2 IMPLANT
SYR TB 1ML LUER SLIP (SYRINGE) ×2 IMPLANT
TOWEL OR 17X24 6PK STRL BLUE (TOWEL DISPOSABLE) ×2 IMPLANT
TOWEL OR 17X26 10 PK STRL BLUE (TOWEL DISPOSABLE) ×4 IMPLANT
TRAY FOLEY CATH 14FR (SET/KITS/TRAYS/PACK) ×2 IMPLANT
WATER STERILE IRR 1000ML POUR (IV SOLUTION) ×6 IMPLANT

## 2012-02-18 NOTE — Brief Op Note (Signed)
02/18/2012  10:54 AM  PATIENT:  Mercedes Carter  56 y.o. female  PRE-OPERATIVE DIAGNOSIS:  Right knee osteoarthritis  POST-OPERATIVE DIAGNOSIS:  Right knee osteoarthritis  PROCEDURE:  Procedure(s): TOTAL KNEE ARTHROPLASTY  SURGEON:  Surgeon(s): Cammy Copa, MD  ASSISTANT: Jodene Nam PA  ANESTHESIA:   general  EBL: 50 ml    Total I/O In: 1500 [I.V.:1500] Out: 160 [Urine:160]  BLOOD ADMINISTERED: none  DRAINS: none   LOCAL MEDICATIONS USED:  none  SPECIMEN:  No Specimen  COUNTS:  YES  TOURNIQUET:   Total Tourniquet Time Documented: Thigh (Right) - 120 minutes  DICTATION: .Other Dictation: Dictation Number [id number]  PLAN OF CARE: Admit to inpatient   PATIENT DISPOSITION:  PACU - hemodynamically stable

## 2012-02-18 NOTE — Progress Notes (Signed)
PT Cancellation Note  Patient Details Name: Mercedes Carter MRN: [id number] DOB: 09-10-1955   Cancelled Treatment:    Reason Eval/Treat Not Completed: Medical issues which prohibited therapy;Patient's level of consciousness  On arrival, pt deeply snoring with periods of apnea. Husband present and reported she had sleep apnea and uses a CPAP at home. Unable to arouse pt with sternal rub or deep pressure/muscle belly roll. Called for RN assist. RN brought SaO2 monitor and pt registered 81% after 2 minutes. Attempted to arouse with deep pressure to nail beds and cool washcloth with pt still not responding. Multiple RN's in to assume care of pt.   Tayana Shankle 02/18/2012, 4:02 PM Pager [phone]

## 2012-02-18 NOTE — Progress Notes (Signed)
Called at 1601 to assist wilth patient with altered mental status.  Unable to go right away - (with Code Stroke patient).  Got verbal assessment of patient from [facility] on 5N ortho floor.  Advised staff to check CBG, give patient second dose of Narcan and call me back.  MD paged prior to RRT call.  On my arrival to floor at 1630 RT present and had placed CPAP on patient. Husband states patient has CPAP at home but wears it inconsistenty.  Patient pale - skin cool and dry- opens eyes briefly to sternal rub - snoring respirations - bil BS present - distant but clear - resps shallow.  O2 sats 95%.  RR 16.  BP 108/52.  HR 105 - placed on monitor - ST.  Patient is s/p TKR today - was on PCA MSO4 - staff reports 24 mg administered since 1115 this AM - started in PACU.  Staff reports no other narcs or sedatives given except 12.5 mg Benadryl IV at 1400.  Abd soft.  Right knee DDI.  CPM machine in place.  CBG 404 - patient not diabetic - repeated 357.  Stat ABG ordered. Stat CBC, Chem 24, and Lactic acid ordered.  Ph 7.24 pcO2 44 pO2 152 NaHCO3 18. Third dose Narcan drawn up.  Dr. Ophelia Charter returned call.  States not to give Narcan.  PCCM consult. Patient opens eyes to loud voice - nods occasionally but still very lethargic. Spoke with Dr. Vassie Loll - update given.  Orders to ICU.  BP 104/54 HR 107  RR 16 O2 sats 96%.   Transferred to 2114.  On arrival [facility] reflects Dilaudid 2 mg IV given in PACU prior to PCA pump. Will now open eyes to loud voice - some appropriate responses.  Handoff to 2100 RN.  Dr. Bard Herbert present.  Family updated and supported.

## 2012-02-18 NOTE — Progress Notes (Signed)
Events noted  thx for help from ccm Pt now more alert and answers ? Ok but still drowses off at times VSS LLE df plantar flexion ok Probable dc to floor am

## 2012-02-18 NOTE — Progress Notes (Signed)
CARE MANAGEMENT NOTE 02/18/2012  Patient:  Mercedes Carter, Mercedes Carter   Account Number:  [id number]  Date Initiated:  02/18/2012  Documentation initiated by:  Vance Peper  Subjective/Objective Assessment:   56 yr old female s/p right total knee arthroplasty     Action/Plan:   Patient preoperatively setup with [facility]   Anticipated DC Date:     Anticipated DC Plan:  HOME W HOME HEALTH SERVICES         Choice offered to / List presented to:             Status of service:  In process, will continue to follow Medicare Important Message given?   (If response is "NO", the following Medicare IM given date fields will be blank) Date Medicare IM given:   Date Additional Medicare IM given:    Discharge Disposition:    Per UR Regulation:    If discussed at Long Length of Stay Meetings, dates discussed:    Comments:

## 2012-02-18 NOTE — Consult Note (Signed)
PULMONARY  / CRITICAL CARE MEDICINE  Name: Mercedes Carter MRN: [id number] DOB: [birth year]    LOS: 0  REFERRING MD :  Dr. Ophelia Charter   CHIEF COMPLAINT:  AMS   BRIEF PATIENT DESCRIPTION: 56 y/o F with PMH of Hypothyroidism, HTN, Asthma, OSA on CPAP (non-compliant) who was admitted by Dr. August Saucer for R total knee arthroplasty secondary to osteoarthritis.  Post surgery received IV benadryl, 3mg  dilaudid, 30 mg toradol, 30 mg morphine via PCA.  Pt became altered on floor with decreased mental status.  Given narcan x2 doses with minimal response, placed on CPAP with modest improvement in mental status and transferred to ICU.    LINES / TUBES:   CULTURES:   ANTIBIOTICS: Cefazolin 11/12>>>  SIGNIFICANT EVENTS:  11/12 - Admit for R total knee, altered mental status with increased doses narcan  LEVEL OF CARE:  ICU PRIMARY SERVICE:  Dr. August Saucer CONSULTANTS:  PCCM CODE STATUS:  Full DIET:  NPO DVT Px:  Coumadin GI Px:  PPI  HISTORY OF PRESENT ILLNESS:  56 y/o F with PMH of Hypothyroidism, HTN, Asthma, OSA on CPAP (non-compliant) who was admitted by Dr. August Saucer for R total knee arthroplasty secondary to osteoarthritis.  Post surgery received IV benadryl, 3mg  dilaudid, 30 mg toradol, 30 mg morphine via PCA.  Pt became altered on floor with decreased mental status.  Given narcan x2 doses with minimal response, placed on CPAP with modest improvement in mental status and transferred to ICU.    PAST MEDICAL HISTORY :  Past Medical History  Diagnosis Date  . Hypothyroidism   . Asthma     no problems in a year  . Sleep apnea     has CPAP but does not use  . Arthritis   . Incontinence of urine   . Hypertension     [facility] PCP- had pt. on HCTZ, but took herself off  2 yrs. ago   Past Surgical History  Procedure Date  . Cholecystectomy   . Arthroscopic  knee     right knee   Prior to Admission medications   Medication Sig Start Date End Date Taking? Authorizing Provider  albuterol (PROVENTIL  HFA;VENTOLIN HFA) 108 (90 BASE) MCG/ACT inhaler Inhale 2 puffs into the lungs every 6 (six) hours as needed. For shortness of breath   Yes Historical Provider, MD  levothyroxine (SYNTHROID, LEVOTHROID) 175 MCG tablet Take 175 mcg by mouth daily.   Yes Historical Provider, MD   No Known Allergies  FAMILY HISTORY:  History reviewed. No pertinent family history. SOCIAL HISTORY:  reports that she has never smoked. She does not have any smokeless tobacco history on file. She reports that she does not drink alcohol or use illicit drugs.  REVIEW OF SYSTEMS:  Unable to complete as patient is altered.    INTERVAL HISTORY:   VITAL SIGNS: Temp:  [97.5 F (36.4 C)-98.4 F (36.9 C)] 97.5 F (36.4 C) (11/12 1803) Pulse Rate:  [71-104] 84  (11/12 1800) Resp:  [9-20] 16  (11/12 1800) BP: (96-161)/(37-92) 97/80 mmHg (11/12 1800) SpO2:  [87 %-100 %] 90 % (11/12 1800) Weight:  [130.8 kg (288 lb 5.8 oz)] 130.8 kg (288 lb 5.8 oz) (11/12 1215)  HEMODYNAMICS:   VENTILATOR SETTINGS:   INTAKE / OUTPUT: Intake/Output      11/11 0701 - 11/12 0700 11/12 0701 - 11/13 0700   I.V. (mL/kg)  1500 (11.5)   IV Piggyback  55   Total Intake(mL/kg)  1555 (11.9)   Urine (mL/kg/hr)  535 (  0.4)   Blood  75   Total Output  610   Net  +945          PHYSICAL EXAMINATION: General:  wdwn obese adult female Neuro:  Drowsy, awakens answers name, falls back to sleep, appropriate HEENT:  Mm pink /moist, thick neck Cardiovascular:  s1s2 rrr, no m/r/g Lungs:  resp's shallow/non-labored, lungs bilaterally diminished but clear Abdomen:  Obese/soft, bsx4 active Musculoskeletal:  R leg dressing c/d/i Skin:  Intact other than surgical incision, no edema   LABS:  Lab 02/18/12 1731 02/18/12 1700 02/18/12 0618  HGB 11.6* -- --  WBC 19.9* -- --  PLT 325 -- --  NA -- -- --  K -- -- --  CL -- -- --  CO2 -- -- --  GLUCOSE -- -- --  BUN -- -- --  CREATININE -- -- --  CALCIUM -- -- --  MG -- -- --  PHOS -- -- --    AST -- -- --  ALT -- -- --  ALKPHOS -- -- --  BILITOT -- -- --  PROT -- -- --  ALBUMIN -- -- --  APTT -- -- 28  INR -- -- 0.98  LATICACIDVEN -- -- --  TROPONINI -- -- --  PROCALCITON -- -- --  PROBNP -- -- --  O2SATVEN -- -- --  PHART -- 7.243* --  PCO2ART -- 43.6 --  PO2ART -- 152.0* --    Lab 02/18/12 1759 02/18/12 1636 02/18/12 1606  GLUCAP 192* 357* 404*    IMAGING: 11/12 CXR>>>  ECG: 11/12 - nsr  DIAGNOSES: Principal Problem:  *Respiratory failure, post-operative Active Problems:  Hypercarbia  Altered mental state  Total knee replacement status  Asthma  Obesity  Hypothyroid  OSA (obstructive sleep apnea)   ASSESSMENT / PLAN:  PULMONARY  ASSESSMENT:   Acute respiratory Failure- in setting of large doses narcotics / OSA post-op R total knee.  ABG with metabolic / respiratory acidosis.   OSA - CPAP but not compliant with it at home  PLAN:   -PRN CPAP -hold narcotics -PRN Narcan -now cxr -aspiration precautions  CARDIOVASCULAR  ASSESSMENT:  Hx of HTN  - BP wnl  PLAN:  -ICU tele / monitoring -EKG reviewed -enzymes now, will cycle if first abnormal  RENAL  ASSESSMENT:   No acute issues.   PLAN:   -now BMP   GASTROINTESTINAL  ASSESSMENT:   NPO - in setting of AMS secondary to narcotics  PLAN:   -NPO until mental status clears  HEMATOLOGIC  ASSESSMENT:   No acute issues.   PLAN:  -now cbc -coumadin for DVT proph  INFECTIOUS  ASSESSMENT:   Noted pyuria prior to admit (asymptomatic).    PLAN:   -monitor  -abx per Ortho (post-op) -check lactic acid   ENDOCRINE  ASSESSMENT:   Hypothyroidism Hyperglycemia - noted on floor, CBG of 404, repeat in ICU 192   PLAN:   -continue synthroid -CBG Q6  NEUROLOGIC  ASSESSMENT:   AMS - in setting of narcotics post R total knee  PLAN:   -hold narcotics -PRN tramadol for now (if any pain) -PRN narcan   CLINICAL SUMMARY: 56 y/o F s/p R total knee 11/12 with significant  dosing of narcotics with subsequent AMS.  Transferred to ICU for close observation.  Airway intact.  No need for intubation at this time.   I have personally obtained a history, examined the patient, evaluated laboratory and imaging results, formulated the assessment and plan and placed orders.  CRITICAL CARE: The patient is critically ill with multiple organ systems failure and requires high complexity decision making for assessment and support, frequent evaluation and titration of therapies, application of advanced monitoring technologies and extensive interpretation of multiple databases. Critical Care Time devoted to patient care services described in this note is 30 minutes.   Canary Brim, NP-C Pulmonary and Critical Care Medicine [facility] Pager: [phone]  02/18/2012, 6:26 PM     Billy Fischer, MD ; [facility] service Mobile [phone].  After 5:30 PM or weekends, call [phone]

## 2012-02-18 NOTE — Anesthesia Preprocedure Evaluation (Addendum)
Anesthesia Evaluation  Patient identified by MRN, date of birth, ID band Patient awake    Reviewed: Allergy & Precautions, H&P , NPO status , Patient's Chart, lab work & pertinent test results  History of Anesthesia Complications Negative for: history of anesthetic complications  Airway Mallampati: II TM Distance: >3 FB Neck ROM: Full    Dental  (+) Teeth Intact and Dental Advisory Given   Pulmonary asthma , sleep apnea and Continuous Positive Airway Pressure Ventilation ,  breath sounds clear to auscultation        Cardiovascular hypertension, Rhythm:Regular Rate:Normal  Untreated   Neuro/Psych    GI/Hepatic Neg liver ROS, GERD-  Poorly Controlled,  Endo/Other  Hypothyroidism   Renal/GU negative Renal ROS     Musculoskeletal   Abdominal (+) + obese,   Peds  Hematology   Anesthesia Other Findings   Reproductive/Obstetrics                          Anesthesia Physical Anesthesia Plan  ASA: II  Anesthesia Plan: General   Post-op Pain Management:    Induction: Intravenous  Airway Management Planned: Oral ETT  Additional Equipment:   Intra-op Plan:   Post-operative Plan: Extubation in OR  Informed Consent: I have reviewed the patients History and Physical, chart, labs and discussed the procedure including the risks, benefits and alternatives for the proposed anesthesia with the patient or authorized representative who has indicated his/her understanding and acceptance.   Dental advisory given  Plan Discussed with: CRNA and Surgeon  Anesthesia Plan Comments:         Anesthesia Quick Evaluation

## 2012-02-18 NOTE — Preoperative (Signed)
Beta Blockers   Reason not to administer Beta Blockers:Not Applicable 

## 2012-02-18 NOTE — Progress Notes (Signed)
1642 Rapid response here cbc,cmet ordered 3rd dose of Narcan 0.4 given opens eyes when name called. Called abg and patient status to Dr Ophelia Charter. ICU bed and lab work ordered.

## 2012-02-18 NOTE — Anesthesia Postprocedure Evaluation (Signed)
  Anesthesia Post-op Note  Patient: Mercedes Carter  Procedure(s) Performed: Procedure(s) (LRB) with comments: TOTAL KNEE ARTHROPLASTY (Right) - Right total knee arthroplasty  Patient Location: PACU  Anesthesia Type:General  Level of Consciousness: awake  Airway and Oxygen Therapy: Patient Spontanous Breathing  Post-op Pain: moderate  Post-op Assessment: Post-op Vital signs reviewed  Post-op Vital Signs: stable  Complications: No apparent anesthesia complications

## 2012-02-18 NOTE — Anesthesia Procedure Notes (Signed)
Procedure Name: Intubation Date/Time: 02/18/2012 7:36 AM Performed by: Lovie Chol Pre-anesthesia Checklist: Patient identified, Emergency Drugs available, Suction available, Patient being monitored and Timeout performed Patient Re-evaluated:Patient Re-evaluated prior to inductionOxygen Delivery Method: Circle system utilized Preoxygenation: Pre-oxygenation with 100% oxygen Intubation Type: IV induction Ventilation: Mask ventilation without difficulty and Oral airway inserted - appropriate to patient size Laryngoscope Size: Miller and 2 Grade View: Grade II Tube type: Oral Tube size: 7.5 mm Number of attempts: 1 Airway Equipment and Method: Stylet Placement Confirmation: ETT inserted through vocal cords under direct vision,  positive ETCO2 and breath sounds checked- equal and bilateral Secured at: 22 cm Tube secured with: Tape Dental Injury: Teeth and Oropharynx as per pre-operative assessment

## 2012-02-18 NOTE — H&P (Signed)
TOTAL KNEE ADMISSION H&P  Patient is being admitted for right total knee arthroplasty.  Subjective:  Chief Complaint:right knee pain.  HPI: Mercedes Carter, 56 y.o. female, has a history of pain and functional disability in the right knee due to arthritis and has failed non-surgical conservative treatments for greater than 12 weeks to includeNSAID's and/or analgesics, corticosteriod injections, flexibility and strengthening excercises and activity modification.  Onset of symptoms was gradual, starting >10 years ago with gradually worsening course since that time. The patient noted prior procedures on the knee to include  arthroscopy on the right knee(s).  Patient currently rates pain in the right knee(s) at 8 out of 10 with activity. Patient has night pain, worsening of pain with activity and weight bearing, pain that interferes with activities of daily living and joint swelling.  Patient has evidence of subchondral cysts, subchondral sclerosis, periarticular osteophytes and joint space narrowing by imaging studies. This patient has had long history of pain. There is no active infection.  There are no active problems to display for this patient.  Past Medical History  Diagnosis Date  . Hypothyroidism   . Asthma     no problems in a year  . Sleep apnea     has CPAP but does not use  . Arthritis   . Incontinence of urine   . Hypertension     [facility] PCP- had pt. on HCTZ, but took herself off  2 yrs. ago    Past Surgical History  Procedure Date  . Cholecystectomy   . Arthroscopic  knee     right knee    Prescriptions prior to admission  Medication Sig Dispense Refill  . albuterol (PROVENTIL HFA;VENTOLIN HFA) 108 (90 BASE) MCG/ACT inhaler Inhale 2 puffs into the lungs every 6 (six) hours as needed. For shortness of breath      . levothyroxine (SYNTHROID, LEVOTHROID) 175 MCG tablet Take 175 mcg by mouth daily.       No Known Allergies  History  Substance Use Topics  . Smoking status:  Never Smoker   . Smokeless tobacco: Not on file  . Alcohol Use: No    History reviewed. No pertinent family history.   Review of Systems  Constitutional: Negative.   HENT: Negative.   Eyes: Negative.   Respiratory: Negative.   Cardiovascular: Negative.   Gastrointestinal: Negative.   Genitourinary: Negative.   Musculoskeletal: Positive for joint pain.  Skin: Negative.   Neurological: Negative.   Endo/Heme/Allergies: Negative.   Psychiatric/Behavioral: Negative.     Objective:  Physical Exam  Constitutional: She appears well-developed.  HENT:  Head: Normocephalic.  Eyes: Pupils are equal, round, and reactive to light.  Neck: Normal range of motion.  Cardiovascular: Normal rate.   Respiratory: Effort normal.  GI: Soft.  Neurological: She is alert.  Skin: Skin is warm.  tender medial and lateral joint line with intact extensor mechanism stable collateral ligaments no groin pain with internal extra rotation of the leg. Pedal pulses palpable. Bibasilar neck is increased. Skin is intact.  Vital signs in last 24 hours: Temp:  [98.4 F (36.9 C)] 98.4 F (36.9 C) (11/12 0619) Pulse Rate:  [99] 99  (11/12 0619) Resp:  [18] 18  (11/12 0619) BP: (161)/(84) 161/84 mmHg (11/12 0619) SpO2:  [95 %] 95 % (11/12 0619)  Labs:   There is no height or weight on file to calculate BMI.   Imaging Review Plain radiographs demonstrate severe degenerative joint disease of the right knee(s). The overall alignment ismild  varus. The bone quality appears to be good for age and reported activity level.  Assessment/Plan:  End stage arthritis, right knee   The patient history, physical examination, clinical judgment of the provider and imaging studies are consistent with end stage degenerative joint disease of the right knee(s) and total knee arthroplasty is deemed medically necessary. The treatment options including medical management, injection therapy arthroscopy and arthroplasty were  discussed at length. The risks and benefits of total knee arthroplasty were presented and reviewed. The risks due to aseptic loosening, infection, stiffness, patella tracking problems, thromboembolic complications and other imponderables were discussed. The patient acknowledged the explanation, agreed to proceed with the plan and consent was signed. Patient is being admitted for inpatient treatment for surgery, pain control, PT, OT, prophylactic antibiotics, VTE prophylaxis, progressive ambulation and ADL's and discharge planning. The patient is planning to be discharged home with home health services

## 2012-02-18 NOTE — Progress Notes (Signed)
ANTICOAGULATION CONSULT NOTE - Initial Consult  Pharmacy Consult for Warfarin Indication: VTE prophylaxis s/p R-TKA on 11/12  No Known Allergies  Patient Measurements: Height: 5\' 9"  (175.3 cm) Weight: 288 lb 5.8 oz (130.8 kg) IBW/kg (Calculated) : 66.2   Vital Signs: Temp: 97.6 F (36.4 C) (11/12 1200) Temp src: Oral (11/12 0619) BP: 114/70 mmHg (11/12 1215) Pulse Rate: 80  (11/12 1215)  Labs:  Basename 02/18/12 0618  HGB --  HCT --  PLT --  APTT 28  LABPROT 12.9  INR 0.98  HEPARINUNFRC --  CREATININE --  CKTOTAL --  CKMB --  TROPONINI --    Estimated Creatinine Clearance: 107.3 ml/min (by C-G formula based on Cr of 0.83).   Medical History: Past Medical History  Diagnosis Date  . Hypothyroidism   . Asthma     no problems in a year  . Sleep apnea     has CPAP but does not use  . Arthritis   . Incontinence of urine   . Hypertension     [facility] PCP- had pt. on HCTZ, but took herself off  2 yrs. ago    Assessment: 56 y.o. F to start warfarin for VTE prophylaxis s/p R-TKA on 11/12. Baseline INR 0.98, warfarin points~5. No CBC yet this admit, but wnl on pre-op labs.   Goal of Therapy:  INR 2-3   Plan:  1. Warfarin 7.5 mg x 1 dose at 1800 today 2. Warfarin book/video 3. Will plan to educate the patient on warfarin prior to discharge 4. Daily PT/INR, CBC q72h 5. Will continue to monitor for any signs/symptoms of bleeding and will follow up with PT/INR in the a.m.   Georgina Pillion, PharmD, BCPS Clinical Pharmacist Pager: [phone] 02/18/2012 1:20 PM

## 2012-02-18 NOTE — Progress Notes (Signed)
eLink Physician-Brief Progress Note Patient Name: Mercedes Carter DOB: 11-06-1955 MRN: [id number]  Date of Service  02/18/2012   HPI/Events of Note   Awake now & c/o pain  eICU Interventions  Resume low dose morphine   Intervention Category Intermediate Interventions: Pain - evaluation and management  ALVA,RAKESH V. 02/18/2012, 7:39 PM

## 2012-02-18 NOTE — Progress Notes (Signed)
Orthopedic Tech Progress Note Patient Details:  Mercedes Carter 1955/06/22 [id number]  CPM Right Knee CPM Right Knee: On Right Knee Flexion (Degrees): 60  Right Knee Extension (Degrees): 0  Additional Comments: TRAPEZE BAR   Shawnie Pons 02/18/2012, 11:41 AM

## 2012-02-18 NOTE — Transfer of Care (Signed)
Immediate Anesthesia Transfer of Care Note  Patient: Mercedes Carter  Procedure(s) Performed: Procedure(s) (LRB) with comments: TOTAL KNEE ARTHROPLASTY (Right) - Right total knee arthroplasty  Patient Location: PACU  Anesthesia Type:General  Level of Consciousness: awake, alert  and oriented  Airway & Oxygen Therapy: Patient Spontanous Breathing and Patient connected to face mask oxygen  Post-op Assessment: Report given to PACU RN and Post -op Vital signs reviewed and stable  Post vital signs: Reviewed and stable  Complications: No apparent anesthesia complications

## 2012-02-18 NOTE — Progress Notes (Signed)
Utilization review completed. Jorgen Wolfinger, RN, BSN. 

## 2012-02-18 NOTE — Progress Notes (Addendum)
1658 Lactic Acid being drawn.Pt on cpap 1729 Report called to unit 2100 Pt transported by bed to rm 2114.BP 108/37 104 ,16 Pt ressponding to painful stimuli by groaning opening eyes when name called.

## 2012-02-18 NOTE — Progress Notes (Signed)
1342 pt c/o itching benadryl 12.5 mg given .At 1620 second dose of narcan 0.4 given 1637 cbg 357

## 2012-02-18 NOTE — Progress Notes (Addendum)
1610 Pt not responding to stimuli snoring type respirations narcan 0.4 given cbg 404 Bp 125/56 95 resp 22.Rapid response called tried to page Dr August Saucer .PCA d/cd bp 125/56 95 po2 87 o2 increased to 4 liters cbg404. Pt to be placed on cpap respiratory in.

## 2012-02-19 DIAGNOSIS — J45909 Unspecified asthma, uncomplicated: Secondary | ICD-10-CM

## 2012-02-19 LAB — GLUCOSE, CAPILLARY: Glucose-Capillary: 83 mg/dL (ref 70–99)

## 2012-02-19 LAB — CBC
Hemoglobin: 10.1 g/dL — ABNORMAL LOW (ref 12.0–15.0)
MCH: 28.7 pg (ref 26.0–34.0)
Platelets: 250 10*3/uL (ref 150–400)
RBC: 3.52 MIL/uL — ABNORMAL LOW (ref 3.87–5.11)
WBC: 9.5 10*3/uL (ref 4.0–10.5)

## 2012-02-19 LAB — URINALYSIS, ROUTINE W REFLEX MICROSCOPIC
Bilirubin Urine: NEGATIVE
Nitrite: NEGATIVE
Specific Gravity, Urine: 1.012 (ref 1.005–1.030)
pH: 5.5 (ref 5.0–8.0)

## 2012-02-19 LAB — PROTIME-INR
INR: 1.08 (ref 0.00–1.49)
Prothrombin Time: 13.9 seconds (ref 11.6–15.2)

## 2012-02-19 LAB — BASIC METABOLIC PANEL
CO2: 29 mEq/L (ref 19–32)
Calcium: 8.5 mg/dL (ref 8.4–10.5)
Potassium: 4.8 mEq/L (ref 3.5–5.1)
Sodium: 139 mEq/L (ref 135–145)

## 2012-02-19 LAB — URINE MICROSCOPIC-ADD ON

## 2012-02-19 LAB — TSH: TSH: 2.054 u[IU]/mL (ref 0.350–4.500)

## 2012-02-19 MED ORDER — SODIUM CHLORIDE 0.45 % IV SOLN: INTRAVENOUS | Status: DC

## 2012-02-19 MED ORDER — IBUPROFEN 800 MG PO TABS
800.0000 mg | ORAL_TABLET | Freq: Three times a day (TID) | ORAL | Status: DC | PRN
  Administered 2012-02-20 – 2012-02-21 (×2): 800 mg via ORAL
  Filled 2012-02-19 (×4): qty 1

## 2012-02-19 MED ORDER — WARFARIN SODIUM 7.5 MG PO TABS
7.5000 mg | ORAL_TABLET | Freq: Once | ORAL | Status: AC
  Administered 2012-02-19: 7.5 mg via ORAL
  Filled 2012-02-19: qty 1

## 2012-02-19 MED ORDER — KETOROLAC TROMETHAMINE 30 MG/ML IJ SOLN: 15.0000 mg | Freq: Once | INTRAMUSCULAR | Status: DC | PRN

## 2012-02-19 MED ORDER — KETOROLAC TROMETHAMINE 30 MG/ML IJ SOLN
30.0000 mg | Freq: Once | INTRAMUSCULAR | Status: AC
  Administered 2012-02-19: 30 mg via INTRAVENOUS
  Filled 2012-02-19: qty 1

## 2012-02-19 NOTE — Progress Notes (Signed)
Hypoglycemic Event  CBG: 56  Treatment: 15 GM carbohydrate snack  Symptoms: None  Follow-up CBG: Time:0045 CBG Result:86  Possible Reasons for Event: Inadequate meal intake  Comments/MD notified:Pt has been NPO since yesterday before surgery. Pt is a non-DM and CBG was raised adequately with a carb snack.     Mercedes Carter  Remember to initiate Hypoglycemia Order Set & complete

## 2012-02-19 NOTE — Progress Notes (Signed)
Difficulty getting pt's pain under control this am. Tried repositioning, application of ice, and pain medication. Pt continues to rate her pain 8-10.

## 2012-02-19 NOTE — Progress Notes (Signed)
Attempted pt on CPM machine. Pt in too much pain. On machine approximately .

## 2012-02-19 NOTE — Consult Note (Signed)
PULMONARY  / CRITICAL CARE MEDICINE  Name: Mercedes Carter MRN: [id number] DOB: September 22, 1955    LOS: 1  REFERRING MD :  Dr. Ophelia Charter   CHIEF COMPLAINT:  AMS   BRIEF PATIENT DESCRIPTION: 56 y/o F with PMH of Hypothyroidism, HTN, Asthma, OSA on CPAP (non-compliant) who was admitted by Dr. August Saucer for R total knee arthroplasty secondary to osteoarthritis.  Post surgery received IV benadryl, 3mg  dilaudid, 30 mg toradol, 30 mg morphine via PCA.  Pt became altered on floor with decreased mental status.  Given narcan x2 doses with minimal response, placed on CPAP with modest improvement in mental status and transferred to ICU.    INTERVAL HISTORY: Patient without O2 support, fully awake and alert, complaining pain and grogginess   LINES / TUBES: PIV  CULTURES: none  ANTIBIOTICS: Cefazolin 11/12 x 2 doses  SIGNIFICANT EVENTS:  11/12 - Admit for R total knee, altered mental status with increased doses narcan  LEVEL OF CARE:  ICU PRIMARY SERVICE:  Dr. August Saucer CONSULTANTS:  PCCM CODE STATUS:  Full DIET:  NPO DVT Px:  Coumadin GI Px:  PPI   VITAL SIGNS: Temp:  [97.5 F (36.4 C)-99.1 F (37.3 C)] 99.1 F (37.3 C) (11/13 0740) Pulse Rate:  [71-107] 103  (11/13 0800) Resp:  [9-22] 13  (11/13 0800) BP: (90-153)/(37-92) 99/56 mmHg (11/13 0800) SpO2:  [87 %-100 %] 97 % (11/13 0800) Weight:  [288 lb 5.8 oz (130.8 kg)] 288 lb 5.8 oz (130.8 kg) (11/12 1215)  Intake/Output Summary (Last 24 hours) at 02/19/12 0940 Last data filed at 02/19/12 0700  Gross per 24 hour  Intake   1530 ml  Output    840 ml  Net    690 ml    HEMODYNAMICS:   VENTILATOR SETTINGS:   INTAKE / OUTPUT: Intake/Output      11/12 0701 - 11/13 0700 11/13 0701 - 11/14 0700   I.V. (mL/kg) 2475 (18.9)    IV Piggyback 55    Total Intake(mL/kg) 2530 (19.3)    Urine (mL/kg/hr) 865 (0.3)    Blood 75    Total Output 940    Net +1590           PHYSICAL EXAMINATION: General: emotionally distressed, obese adult female, in  right knee mobilizer Neuro:  Alert & oriented x 4, emotionally labile HEENT:  Mm pink /moist, thick neck Cardiovascular:  s1s2 rrr, no m/r/g Lungs:  Normal WOB, lungs CTA-B Abdomen:  Obese/soft, bsx4 active Musculoskeletal:  R leg dressing c/d/i Skin:  Intact other than surgical incision, no edema   LABS:  Lab 02/19/12 0508 02/18/12 1731 02/18/12 1730 02/18/12 1700 02/18/12 0618  HGB 10.1* 11.6* -- -- --  WBC 9.5 19.9* -- -- --  PLT 250 325 -- -- --  NA 139 136 -- -- --  K 4.8 5.3* -- -- --  CL 101 98 -- -- --  CO2 29 24 -- -- --  GLUCOSE 101* 298* -- -- --  BUN 17 16 -- -- --  CREATININE 0.89 0.99 -- -- --  CALCIUM 8.5 8.7 -- -- --  MG -- -- -- -- --  PHOS -- -- -- -- --  AST -- 65* -- -- --  ALT -- 47* -- -- --  ALKPHOS -- 79 -- -- --  BILITOT -- 0.2* -- -- --  PROT -- 7.0 -- -- --  ALBUMIN -- 3.5 -- -- --  APTT -- -- -- -- 28  INR 1.08 -- -- -- 0.98  LATICACIDVEN -- -- 5.3* -- --  TROPONINI -- <0.30 -- -- --  PROCALCITON -- -- -- -- --  PROBNP -- -- -- -- --  O2SATVEN -- -- -- -- --  PHART -- -- -- 7.243* --  PCO2ART -- -- -- 43.6 --  PO2ART -- -- -- 152.0* --    Lab 02/19/12 0642 02/19/12 0205 02/19/12 0046 02/19/12 0027 02/18/12 1759  GLUCAP 104* 83 67* 56* 192*    IMAGING: 11/12 CXR>>>mild atx  ECG: 11/12 - nsr  DIAGNOSES: Principal Problem:  *Respiratory failure, post-operative Active Problems:  Hypercarbia  Asthma  Obesity  Altered mental state  Hypothyroid  OSA (obstructive sleep apnea)  Total knee replacement status   ASSESSMENT / PLAN:  PULMONARY  ASSESSMENT:   Acute respiratory Failure- in setting of large doses narcotics / OSA post-op R total knee.  ABG with metabolic / respiratory acidosis.   OSA - CPAP but not compliant with it at home  PLAN:   -PRN CPAP, auto titration or obtain home pressure - IS aggressive -no O2 needs needed Last abg reviewed, clinically has resolved this  CARDIOVASCULAR Cardiac Enzymes:  Lab  02/18/12 1731  CKTOTAL 470*  CKMB 6.0*  CKMBINDEX --  TROPONINI <0.30     ASSESSMENT:  Hx of HTN  - BP wnl  PLAN:  -ICU tele / monitoring, consider dc -normotension  RENAL  ASSESSMENT:   No acute issues.   PLAN:   -when diet started, then kvo Repeat K in am, dc k in meds  GASTROINTESTINAL  ASSESSMENT:  No acute issues   PLAN:   Advance diet to regular   HEMATOLOGIC  ASSESSMENT:   Hgb stable post-op  High risk DVT PLAN:  -now cbc -coumadin for DVT proph per ortho Pt   INFECTIOUS  ASSESSMENT:   Noted pyuria prior to admit (asymptomatic).    PLAN:   -monitor  -abx per Ortho (post-op) -check lactic acid- 5.3, bicarb now wnl, likely has cleared: from tourniquet -UA repeat, if pos again would consider treatment -Dc foley then UA  ENDOCRINE CBG (last 3)   Basename 02/19/12 0642 02/19/12 0205 02/19/12 0046  GLUCAP 104* 83 67*     ASSESSMENT:   Hypothyroidism Hyperglycemia - noted on floor, CBG of 404, repeat in ICU 192   PLAN:   -continue synthroid -CBG Q6  NEUROLOGIC  ASSESSMENT:   AMS - in setting of narcotics post R total knee  PLAN:   On Norco per Dr. August Saucer, given tylenol and ketorolac for current pain  CLINICAL SUMMARY: 56 y/o F s/p R total knee 11/12 with significant dosing of narcotics with subsequent AMS.  Problem resolved and patient appropriate for transfer to the floor.   Si Raider Clinton Sawyer, MD, MBA 02/19/2012, 9:46 AM Family Medicine Resident, PGY-2 [phone] pager  Mcarthur Rossetti. Tyson Alias, MD, FACP Pgr: [phone] [facility]   Will sign off, call if needed

## 2012-02-19 NOTE — Progress Notes (Signed)
Subjective: Pt more awake this am - having pain   Objective: Vital signs in last 24 hours: Temp:  [97.5 F (36.4 C)-99.1 F (37.3 C)] 99.1 F (37.3 C) (11/13 0740) Pulse Rate:  [71-107] 107  (11/13 0700) Resp:  [9-22] 10  (11/13 0700) BP: (90-153)/(37-92) 99/47 mmHg (11/13 0700) SpO2:  [87 %-100 %] 97 % (11/13 0700) Weight:  [130.8 kg (288 lb 5.8 oz)] 130.8 kg (288 lb 5.8 oz) (11/12 1215)  Intake/Output from previous day: 11/12 0701 - 11/13 0700 In: 2530 [I.V.:2475; IV Piggyback:55] Out: 940 [Urine:865; Blood:75] Intake/Output this shift:    Exam:  Neurovascular intact Sensation intact distally Intact pulses distally Dorsiflexion/Plantar flexion intact  Labs:  Basename 02/19/12 0508 02/18/12 1731  HGB 10.1* 11.6*    Basename 02/19/12 0508 02/18/12 1731  WBC 9.5 19.9*  RBC 3.52* 4.05  HCT 31.7* 36.5  PLT 250 325    Basename 02/19/12 0508 02/18/12 1731  NA 139 136  K 4.8 5.3*  CL 101 98  CO2 29 24  BUN 17 16  CREATININE 0.89 0.99  GLUCOSE 101* 298*  CALCIUM 8.5 8.7    Basename 02/19/12 0508 02/18/12 0618  LABPT -- --  INR 1.08 0.98    Assessment/Plan: Ok to transfer to floor pending ccm eval - would dc tramadol and try norco 5 1 po q 4 for starters with no iv pain meds - cont cpm at lower rom 30 degrees   DEAN,GREGORY SCOTT 02/19/2012, 8:30 AM

## 2012-02-19 NOTE — Op Note (Signed)
Mercedes Carter, Mercedes Carter              ACCOUNT NO.:  [id number]  MEDICAL RECORD NO.:  [id number]  LOCATION:  2114                         FACILITY:  [facility]  PHYSICIAN:  Burnard Bunting, M.D.    DATE OF BIRTH:  1955/11/10  DATE OF PROCEDURE:  02/18/2012 DATE OF DISCHARGE:                              OPERATIVE REPORT   PREOPERATIVE DIAGNOSIS:  Right knee arthritis.  POSTOPERATIVE DIAGNOSIS:  Right knee arthritis.  PROCEDURE:  Right total knee replacement using Stryker posterior cruciate sacrificing cemented components, 6 femur, 5 tibia, 13 poly insert, 35 patella.  SURGEON:  Burnard Bunting, MD  ASSISTANT:  Wende Neighbors, PA  ANESTHESIA:  General endotracheal.  ESTIMATED BLOOD LOSS:  50 mL.  DRAINS:  None.  TOURNIQUET TIME:  Two hours at 300 mmHg.  INDICATIONS:  Mercedes Carter is a 56 year old female with bilateral knee arthritis, right worse than left.  She presents now for total knee replacement after failure of conservative management and explanation of risks and benefits.  She has end-stage debilitating arthritis with joint deformity and sclerosis.  She has failed conservative measures, understands the risks and benefits and wishes to proceed with total knee replacement.  PROCEDURE IN DETAIL:  The patient was brought to the operating room, where general endotracheal anesthesia was induced.  Preoperative antibiotics were administered.  Time-out was called.  Right leg was prescrubbed with alcohol and Betadine, which was allowed to air dry , prepped with DuraPrep solution, draped in sterile manner.  Collier Flowers was used to cover the operative field.  Time-out was called.  Left leg was elevated, exsanguinated with Esmarch wrap.  Tourniquet was inflated. Anterior approach to the knee was made.  Skin and subcutaneous tissue was sharply divided.  Median parapatellar approach was made.  Fat pad partially excised.  Lateral patellofemoral ligament released.  Soft tissue on the distal  aspect of the femur removed anteriorly. Osteophytes also removed with the rongeur.  ACL PCL released.  Tibia was cut first with collaterals, posterior structures protected, 9 mm off the least affected lateral side was taken.  At this time distal cut of 10 mm was made off the femur.  Intramedullary alignment was used for both. Spacer block was placed and the knee had good alignment.  At this time, 4:1 cutting block was applied followed by the box cutter.  The femur was then placed and a trial tibial base plate was placed.  Correct rotation was marked roughly in line with the tibial tubercle.  Tibia was keel punched.  Trial components were placed.  The patella was cut to size 13 from 20, 35 patella was then placed.  Trial reduction was performed with 11 and 13 spacer.  The patient had excellent extension, full range of motion, good stability of varus valgus stress at 0, 30, and 90 degrees with excellent patellar tracking.  No thumbs technique.  Trial components were removed.  True components placed with excess cement removed.  Same stability parameters were maintained with full extension, full flexion, good stability to varus valgus stress, and excellent patellar tracking using the 13 insert.  Tourniquet was then released. Bleeding points encountered and controlled using electrocautery. Thorough irrigation with 6 L of  irrigating solution was performed. Incision was then closed over bolster using a #1 Vicryl suture, 0 Vicryl suture, 2-0 Vicryl suture and skin staples.  Solution of Marcaine and morphine finally injected to the knee.  The patient tolerated the procedure well without immediate complication.  Transferred to recovery room in stable condition.     Burnard Bunting, M.D.     GSD/MEDQ  D:  02/18/2012  T:  02/19/2012  Job:  [id number]

## 2012-02-19 NOTE — Progress Notes (Signed)
Utilization review completed. Oddie Kuhlmann, RN, BSN. 

## 2012-02-19 NOTE — Evaluation (Signed)
Physical Therapy Evaluation Patient Details Name: Mercedes Carter MRN: [id number] DOB: 1956/03/12 Today's Date: 02/19/2012 Time: 1510-1550 PT Time Calculation (min): 40 min  PT Assessment / Plan / Recommendation Clinical Impression  Pt s/p R TKA (02/18/12) with respiratory complications post-op.  Pt recovering well and currently has pain controlled with pain medications.  She was awake/alert and pleasant to work with.  Pt ambulated 15 ft with RW on RA, became ill feeling, rested, walked additional 33ft, stopped due to fatigue.  Pt's husband will be home for one week to care for her.  Recommend HHPT for 1 week as she will have care at home and then transfer to outpatient PT to return to PLOF.      PT Assessment  Patient needs continued PT services    Follow Up Recommendations  Home health PT (For 1 week, then transfer to Outpatient PT)    Does the patient have the potential to tolerate intense rehabilitation      Barriers to Discharge  None      Equipment Recommendations  None recommended by PT    Recommendations for Other Services     Frequency Min 3X/week    Precautions / Restrictions Precautions Precautions: Fall   Pertinent Vitals/Pain HR 96-102bpm with activity O2 sats >97% with activity on RA BP: 107/56 after activity  Pain 4/10 at rest, 9/10 with ambulation      Mobility  Bed Mobility Bed Mobility: Not assessed Transfers Transfers: Sit to Stand;Stand to Sit Sit to Stand: 4: Min assist (req vc for proper hand and body position and to slow down) Stand to Sit: 4: Min assist (req vc for proper hand and body position and to slow down) Details for Transfer Assistance: pt is impulsive and states "I figure if I move fast I'll get it down with."  Ambulation/Gait Ambulation/Gait Assistance: 3: Mod assist Ambulation Distance (Feet): 35 Feet (16ft x1; 25ft x1) Assistive device: Rolling walker Ambulation/Gait Assistance Details: req vc to properly and safely use RW with  correct gait pattern using step-to leading with R foot as to decrease WBAT on R Gait Pattern: Step-to pattern;Decreased stance time - right;Decreased step length - left;Decreased stride length;Trunk flexed Gait velocity: slowed Stairs: No Wheelchair Mobility Wheelchair Mobility: No         Exercises General Exercises - Lower Extremity Ankle Circles/Pumps: AROM;Both;20 reps Quad Sets: Strengthening;Both;10 reps   PT Diagnosis: Difficulty walking  PT Problem List: Decreased strength;Decreased range of motion;Decreased activity tolerance;Decreased balance;Decreased mobility;Decreased coordination;Decreased cognition;Decreased knowledge of use of DME;Obesity;Pain PT Treatment Interventions: DME instruction;Gait training;Functional mobility training;Therapeutic activities;Therapeutic exercise;Patient/family education;Neuromuscular re-education;Balance training   PT Goals Acute Rehab PT Goals PT Goal Formulation: With patient Time For Goal Achievement: 03/04/12 Potential to Achieve Goals: Good Pt will go Sit to Stand: with modified independence;with upper extremity assist PT Goal: Sit to Stand - Progress: Goal set today Pt will go Stand to Sit: with modified independence;with upper extremity assist PT Goal: Stand to Sit - Progress: Goal set today Pt will Transfer Bed to Chair/Chair to Bed: with modified independence PT Transfer Goal: Bed to Chair/Chair to Bed - Progress: Goal set today Pt will Stand: with modified independence PT Goal: Stand - Progress: Goal set today Pt will Ambulate: 51 - 150 feet;with least restrictive assistive device;with modified independence PT Goal: Ambulate - Progress: Goal set today  Visit Information  Last PT Received On: 02/19/12 Assistance Needed: +2    Subjective Data  Subjective: "I'm doing better now, but when I've got to go (  to the bathroom), I've got to go."   Patient Stated Goal: To go home   Prior Functioning  Home Living Lives With:  Spouse;Family (8 y/o son and 69 y/o father-in-law) Available Help at Discharge: Family (husband for one week) Type of Home: House Home Access: Stairs to enter [employer] of Steps: 2 Home Layout: Two level;Full bath on main level;Able to live on main level with bedroom/bathroom Alternate Level Stairs-Number of Steps: Steps to basement = one flight Alternate Level Stairs-Rails: Can reach both Bathroom Shower/Tub: Tub/shower unit;Curtain Bathroom Toilet: Standard Bathroom Accessibility: Yes Home Adaptive Equipment: Bedside commode/3-in-1;Walker - rolling (riser without handles) Prior Function Level of Independence: Independent Able to Take Stairs?: Yes Driving: Yes Vocation: Full time employment Communication Communication: No difficulties    Cognition  Overall Cognitive Status: Appears within functional limits for tasks assessed/performed Arousal/Alertness: Awake/alert Orientation Level: Appears intact for tasks assessed Behavior During Session: [facility] for tasks performed    Extremity/Trunk Assessment Right Upper Extremity Assessment RUE ROM/Strength/Tone: [facility] for tasks assessed Left Upper Extremity Assessment LUE ROM/Strength/Tone: [facility] for tasks assessed Right Lower Extremity Assessment RLE ROM/Strength/Tone: Deficits RLE ROM/Strength/Tone Deficits: s/p R TKA 02/18/12; straight leg immobilizer and compression wrap while not using CPM.  Left Lower Extremity Assessment LLE ROM/Strength/Tone: Deficits LLE ROM/Strength/Tone Deficits: grossly 4/5    Balance Balance Balance Assessed: No  End of Session PT - End of Session Activity Tolerance: Patient limited by fatigue;Patient limited by pain Patient left: in chair;with call bell/phone within reach;with family/visitor present Nurse Communication: Mobility status (impulsive behavior and pt request pain meds) CPM Right Knee CPM Right Knee: Off       Sharion Balloon 02/19/2012, 4:29 PM  Sharion Balloon, SPT Acute Rehab  Services [phone]

## 2012-02-19 NOTE — Progress Notes (Signed)
ANTICOAGULATION CONSULT NOTE - Follow Up Consult  Pharmacy Consult for Coumadin Indication: VTE prophylaxis  No Known Allergies  Patient Measurements: Height: 5\' 9"  (175.3 cm) Weight: 288 lb 5.8 oz (130.8 kg) IBW/kg (Calculated) : 66.2   Vital Signs: Temp: 99.1 F (37.3 C) (11/13 0740) Temp src: Oral (11/13 0740) BP: 99/56 mmHg (11/13 0800) Pulse Rate: 103  (11/13 0800)  Labs:  Basename 02/19/12 0508 02/18/12 1731 02/18/12 0618  HGB 10.1* 11.6* --  HCT 31.7* 36.5 --  PLT 250 325 --  APTT -- -- 28  LABPROT 13.9 -- 12.9  INR 1.08 -- 0.98  HEPARINUNFRC -- -- --  CREATININE 0.89 0.99 --  CKTOTAL -- 470* --  CKMB -- 6.0* --  TROPONINI -- <0.30 --    Estimated Creatinine Clearance: 100.1 ml/min (by C-G formula based on Cr of 0.89).   Medications:  Prescriptions prior to admission  Medication Sig Dispense Refill  . albuterol (PROVENTIL HFA;VENTOLIN HFA) 108 (90 BASE) MCG/ACT inhaler Inhale 2 puffs into the lungs every 6 (six) hours as needed. For shortness of breath      . levothyroxine (SYNTHROID, LEVOTHROID) 175 MCG tablet Take 175 mcg by mouth daily.        Assessment: 56 y.o. F started on Coumadin for VTE prophylaxis s/p R-TKA on 11/12. INR is subtherapeutic today, as expected s/p first Coumadin dose last PM. H/H and plts decr slightly- appear hemodiluted. No overt bleeding is reported. Patient was transferred to ICU d/t resp distress following pain meds post-op; noted plans to transfer out today. Noted patient has SCDs on for VTE px.  Goal of Therapy:  INR 2-3 Monitor platelets by anticoagulation protocol: Yes   Plan:  - Repeat Coumadin 7.5mg  PO x 1 today - Will continue to monitor daily PT/INR - Will f/up to complete Coumadin education once on regular floor  Thanks, Rayshell Goecke K. Allena Katz, PharmD, BCPS.  Clinical Pharmacist Pager [phone]. 02/19/2012 9:19 AM

## 2012-02-19 NOTE — Progress Notes (Signed)
Orthopedic Tech Progress Note Patient Details:  Mercedes Carter 1955-07-27 [id number]  Patient ID: Mercedes Carter, female   DOB: [birth year], 56 y.o.   MRN: [id number]   Mercedes Carter 02/19/2012, 9:34 AM PUT PATIENT IN CPM AT 9AM O-30 DEGREES.

## 2012-02-19 NOTE — Progress Notes (Signed)
Orthopedic Tech Progress Note Patient Details:  Mercedes Carter [birth year] [id number]  Patient ID: Mercedes Carter, female   DOB: 10-25-1955, 56 y.o.   MRN: [id number]   Mercedes Carter 02/19/2012, 9:31 AM PUT PATIENT IN CPM Tuesday 8PM O-30 DEGREES.

## 2012-02-19 NOTE — Evaluation (Signed)
Occupational Therapy Evaluation Patient Details Name: Mercedes Carter MRN: [id number] DOB: 01-15-1956 Today's Date: 02/19/2012 Time: 4098-1191 OT Time Calculation (min): 25 min  OT Assessment / Plan / Recommendation Clinical Impression  Pt. admitted for Rt. TKA (02/18/12) with respiratory complications post-op. Pt. with good family support.  Pt. will benefit from acute OT for the below listed deficits to allow pt to return home with min A  from family    OT Assessment  Patient needs continued OT Services    Follow Up Recommendations  No OT follow up;Supervision/Assistance - 24 hour    Barriers to Discharge None    Equipment Recommendations  Tub/shower bench (questionable)    Recommendations for Other Services    Frequency  Min 2X/week    Precautions / Restrictions Precautions Precautions: Fall;Knee Required Braces or Orthoses: Knee Immobilizer - Right Restrictions Weight Bearing Restrictions: Yes RLE Weight Bearing: Weight bearing as tolerated       ADL  Eating/Feeding: Independent Where Assessed - Eating/Feeding: Chair Grooming: Wash/dry hands;Wash/dry face;Teeth care;Set up Where Assessed - Grooming: Supported sitting Upper Body Bathing: Minimal assistance Where Assessed - Upper Body Bathing: Supported sitting Lower Body Bathing: Maximal assistance Where Assessed - Lower Body Bathing: Supported sit to stand Upper Body Dressing: Set up Where Assessed - Upper Body Dressing: Unsupported sitting Lower Body Dressing: Maximal assistance Where Assessed - Lower Body Dressing: Supported sit to Pharmacist, hospital: Moderate assistance Toilet Transfer Method: Sit to stand;Stand pivot Toilet Transfer Equipment: Bedside commode Toileting - Clothing Manipulation and Hygiene: Maximal assistance Where Assessed - Glass blower/designer Manipulation and Hygiene: Standing Equipment Used: Rolling walker Transfers/Ambulation Related to ADLs: Pt ambulates with mod A and mod verbal cues  for safety ADL Comments: Pt. initially with dizziness and nausea with first attempts with functional mobility.  With a rest break, pt requested to attempt again.  Pt. with good suppport at home.      OT Diagnosis: Generalized weakness;Acute pain  OT Problem List: Decreased strength;Decreased activity tolerance;Decreased safety awareness;Decreased knowledge of use of DME or AE;Decreased knowledge of precautions;Pain;Obesity OT Treatment Interventions: Self-care/ADL training;DME and/or AE instruction;Therapeutic activities;Patient/family education   OT Goals Acute Rehab OT Goals OT Goal Formulation: With patient ADL Goals Pt Will Perform Grooming: with supervision;Standing at sink ADL Goal: Grooming - Progress: Goal set today Pt Will Perform Lower Body Bathing: with supervision;Sit to stand from chair;Sit to stand from bed (with AE) ADL Goal: Lower Body Bathing - Progress: Goal set today Pt Will Perform Lower Body Dressing: with supervision;Sit to stand from chair;Sit to stand from bed (with AE PRN) ADL Goal: Lower Body Dressing - Progress: Goal set today Pt Will Transfer to Toilet: with supervision;Ambulation;3-in-1 ADL Goal: Toilet Transfer - Progress: Goal set today Pt Will Perform Toileting - Clothing Manipulation: with supervision;Standing ADL Goal: Toileting - Clothing Manipulation - Progress: Goal set today Pt Will Perform Tub/Shower Transfer: with min assist;with DME;Ambulation ADL Goal: Tub/Shower Transfer - Progress: Goal set today  Visit Information  Last OT Received On: 02/19/12 Assistance Needed: +2 PT/OT Co-Evaluation/Treatment: Yes    Subjective Data  Subjective: "I am not sure how I am going to take a shower" Patient Stated Goal: To get better   Prior Functioning     Home Living Lives With: Spouse;Family Available Help at Discharge: Family Type of Home: House Home Access: Stairs to enter Entrance Stairs-Number of Steps: 2 Home Layout: Two level;Full bath on  main level;Able to live on main level with bedroom/bathroom Alternate Level Stairs-Number of Steps: Steps to  basement = one flight Alternate Level Stairs-Rails: Can reach both Bathroom Shower/Tub: Tub/shower unit;Curtain Bathroom Toilet: Standard Bathroom Accessibility: Yes How Accessible: Accessible via walker Home Adaptive Equipment: Bedside commode/3-in-1;Walker - rolling Prior Function Level of Independence: Independent Able to Take Stairs?: Yes Driving: Yes Vocation: Full time employment Communication Communication: No difficulties         Vision/Perception     Cognition  Overall Cognitive Status: Appears within functional limits for tasks assessed/performed Arousal/Alertness: Awake/alert Orientation Level: Appears intact for tasks assessed Behavior During Session: [facility] for tasks performed    Extremity/Trunk Assessment Right Upper Extremity Assessment RUE ROM/Strength/Tone: WFL for tasks assessed RUE Coordination: WFL - gross/fine motor Left Upper Extremity Assessment LUE ROM/Strength/Tone: WFL for tasks assessed LUE Coordination: WFL - gross/fine motor Right Lower Extremity Assessment RLE ROM/Strength/Tone: Deficits RLE ROM/Strength/Tone Deficits: s/p R TKA 02/18/12; straight leg immobilizer and compression wrap while not using CPM.  Left Lower Extremity Assessment LLE ROM/Strength/Tone: Deficits LLE ROM/Strength/Tone Deficits: grossly 4/5      Mobility Bed Mobility Bed Mobility: Not assessed Transfers Transfers: Sit to Stand;Stand to Sit Sit to Stand: 4: Min assist;From chair/3-in-1;With upper extremity assist Stand to Sit: 4: Min assist;With upper extremity assist;To chair/3-in-1 Details for Transfer Assistance: pt is impulsive and states "I figure if I move fast I'll get it down with."      Shoulder Instructions     Exercise General Exercises - Lower Extremity Ankle Circles/Pumps: AROM;Both;20 reps Quad Sets: Strengthening;Both;10 reps   Balance  Balance Balance Assessed: No   End of Session OT - End of Session Equipment Utilized During Treatment: Right knee immobilizer Activity Tolerance: Patient tolerated treatment well Patient left: in chair;with call bell/phone within reach;with family/visitor present CPM Right Knee CPM Right Knee: Off  GO     Jeani Hawking M 02/19/2012, 6:03 PM

## 2012-02-19 NOTE — Progress Notes (Signed)
Placed pt on cpap machine 6 cm h20. Pt states this is what she wears at home.  Tolerating well, no issues to report.

## 2012-02-19 NOTE — Evaluation (Signed)
Agree with the note below. 02/19/2012  Clayton Bing, PT [phone] (pager)

## 2012-02-20 ENCOUNTER — Encounter (HOSPITAL_COMMUNITY): Payer: Self-pay | Admitting: General Practice

## 2012-02-20 LAB — BASIC METABOLIC PANEL
Chloride: 98 mEq/L (ref 96–112)
GFR calc Af Amer: >90 mL/min (ref >90)
Potassium: 3.8 mEq/L (ref 3.5–5.1)

## 2012-02-20 LAB — CBC
Platelets: 225 10*3/uL (ref 150–400)
RDW: 13.8 % (ref 11.5–15.5)
WBC: 8.6 10*3/uL (ref 4.0–10.5)

## 2012-02-20 LAB — PROTIME-INR: INR: 1.56 — ABNORMAL HIGH (ref 0.00–1.49)

## 2012-02-20 MED ORDER — WARFARIN SODIUM 5 MG PO TABS
5.0000 mg | ORAL_TABLET | Freq: Once | ORAL | Status: AC
  Administered 2012-02-20: 5 mg via ORAL
  Filled 2012-02-20: qty 1

## 2012-02-20 NOTE — Progress Notes (Signed)
ANTICOAGULATION CONSULT NOTE - Follow Up Consult  Pharmacy Consult for Coumadin Indication: VTE prophylaxis s/p R-TKA  No Known Allergies  Patient Measurements: Height: 5\' 9"  (175.3 cm) Weight: 288 lb 5.8 oz (130.8 kg) IBW/kg (Calculated) : 66.2   Vital Signs: Temp: 98.6 F (37 C) (11/14 0504) BP: 117/54 mmHg (11/14 0504) Pulse Rate: 107  (11/14 0504)  Labs:  Basename 02/20/12 0600 02/19/12 0508 02/18/12 1731 02/18/12 0618  HGB 9.9* 10.1* -- --  HCT 29.7* 31.7* 36.5 --  PLT 225 250 325 --  APTT -- -- -- 28  LABPROT 18.2* 13.9 -- 12.9  INR 1.56* 1.08 -- 0.98  HEPARINUNFRC -- -- -- --  CREATININE 0.77 0.89 0.99 --  CKTOTAL -- -- 470* --  CKMB -- -- 6.0* --  TROPONINI -- -- <0.30 --    Estimated Creatinine Clearance: 111.3 ml/min (by C-G formula based on Cr of 0.77).   Medications:  Prescriptions prior to admission  Medication Sig Dispense Refill  . albuterol (PROVENTIL HFA;VENTOLIN HFA) 108 (90 BASE) MCG/ACT inhaler Inhale 2 puffs into the lungs every 6 (six) hours as needed. For shortness of breath      . levothyroxine (SYNTHROID, LEVOTHROID) 175 MCG tablet Take 175 mcg by mouth daily.        Assessment: 56 y.o. F started on Coumadin for VTE prophylaxis s/p R-TKA on 11/12. INR is rising from 0.98 to 1.08 to 1.56 today s/p 2 doses of 7.5mg . H/H and plts decr slightly, but stable- appear hemodiluted. No overt bleeding is reported. Patient was transferred to ICU 11/12 d/t resp distress following pain meds post-op and transferred back to floor 11/13. Mental status is improved.   Goal of Therapy:  INR 2-3 Monitor platelets by anticoagulation protocol: Yes   Plan:  - Coumadin 5mg  PO x 1 today - Will continue to monitor daily PT/INR - Follow-up CBC.   Link Snuffer, PharmD, BCPS Clinical Pharmacist [phone] 02/20/2012 10:37 AM

## 2012-02-20 NOTE — Progress Notes (Signed)
I agree with the following treatment note after reviewing documentation.   Johnston, Wah Sabic Brynn   OTR/L Pager: [phone] Office: [phone] .   

## 2012-02-20 NOTE — Progress Notes (Signed)
Physical Therapy Treatment Patient Details Name: Mercedes Carter MRN: [id number] DOB: Oct 01, 1955 Today's Date: 02/20/2012 Time: 7829-5621 PT Time Calculation (min): 26 min  PT Assessment / Plan / Recommendation Comments on Treatment Session  Pt admitted s/p right TKA with AMS developing post-op with respiratory issues. Medical issues resolved and pt continues to progress with therapy. Able to increase ambulation distance as well as independence with mobility today.    Follow Up Recommendations  Home health PT     Does the patient have the potential to tolerate intense rehabilitation     Barriers to Discharge        Equipment Recommendations  None recommended by PT    Recommendations for Other Services    Frequency 7X/week   Plan Discharge plan remains appropriate;Frequency remains appropriate    Precautions / Restrictions Precautions Precautions: Fall;Knee Precaution Booklet Issued: No Required Braces or Orthoses: Knee Immobilizer - Right Knee Immobilizer - Right: On when out of bed or walking Restrictions Weight Bearing Restrictions: Yes RLE Weight Bearing: Weight bearing as tolerated   Pertinent Vitals/Pain 5/10 in right knee. Pt repositioned.    Mobility  Bed Mobility Bed Mobility: Supine to Sit Supine to Sit: 4: Min assist Details for Bed Mobility Assistance: Assist for right LE with cues for sequence and safety. Transfers Transfers: Sit to Stand;Stand to Sit (2 trials.) Sit to Stand: 4: Min guard;With upper extremity assist;From bed;From chair/3-in-1 Stand to Sit: 4: Min guard;With upper extremity assist;To chair/3-in-1 Details for Transfer Assistance: Guarding for balance with cues for safest hand/right LE placement. Ambulation/Gait Ambulation/Gait Assistance: 4: Min assist Ambulation Distance (Feet): 65 Feet (15 and 50 feet.) Assistive device: Rolling walker Ambulation/Gait Assistance Details: Assist for balance with cues for tall posture with safety inside RW  as well as proper sequence. Gait Pattern: Step-to pattern;Decreased step length - right;Decreased stance time - right;Trunk flexed;Antalgic Gait velocity: slowed Stairs: No Wheelchair Mobility Wheelchair Mobility: No    Exercises Total Joint Exercises Ankle Circles/Pumps: AROM;Right;10 reps;Supine Quad Sets: AROM;Right;10 reps;Supine Heel Slides: AAROM;Right;10 reps;Supine Hip ABduction/ADduction: AAROM;Right;10 reps;Supine Straight Leg Raises: AAROM;Right;10 reps;Supine Goniometric ROM: AA/ROM right knee 0-30 degrees.   PT Diagnosis:    PT Problem List:   PT Treatment Interventions:     PT Goals Acute Rehab PT Goals PT Goal Formulation: With patient Time For Goal Achievement: 03/04/12 Potential to Achieve Goals: Good PT Goal: Sit to Stand - Progress: Progressing toward goal PT Goal: Stand to Sit - Progress: Progressing toward goal PT Goal: Ambulate - Progress: Progressing toward goal Pt will Perform Home Exercise Program: Independently PT Goal: Perform Home Exercise Program - Progress: Goal set today  Visit Information  Last PT Received On: 02/20/12 Assistance Needed: +1    Subjective Data  Subjective: "I'm doing great this morning." Patient Stated Goal: To go home   Cognition  Overall Cognitive Status: Appears within functional limits for tasks assessed/performed Arousal/Alertness: Awake/alert Orientation Level: Appears intact for tasks assessed Behavior During Session: [facility] for tasks performed    Balance  Balance Balance Assessed: No  End of Session PT - End of Session Equipment Utilized During Treatment: Gait belt;Right knee immobilizer Activity Tolerance: Patient tolerated treatment well Patient left: in chair;with call bell/phone within reach Nurse Communication: Mobility status   GP     Cephus Shelling 02/20/2012, 9:14 AM  02/20/2012 Cephus Shelling, PT, DPT [phone]

## 2012-02-20 NOTE — Progress Notes (Signed)
Subjective: Pt stable - awake - pain controlled on po pain meds   Objective: Vital signs in last 24 hours: Temp:  [98.4 F (36.9 C)-98.6 F (37 C)] 98.6 F (37 C) (11/14 0504) Pulse Rate:  [95-107] 107  (11/14 0504) Resp:  [10-20] 16  (11/14 0504) BP: (99-133)/(52-67) 117/54 mmHg (11/14 0504) SpO2:  [93 %-100 %] 93 % (11/14 0504)  Intake/Output from previous day: 11/13 0701 - 11/14 0700 In: 225 [I.V.:225] Out: 870 [Urine:870] Intake/Output this shift:    Exam:  Neurovascular intact Sensation intact distally Intact pulses distally Dorsiflexion/Plantar flexion intact  Labs:  Basename 02/20/12 0600 02/19/12 0508 02/18/12 1731  HGB 9.9* 10.1* 11.6*    Basename 02/20/12 0600 02/19/12 0508  WBC 8.6 9.5  RBC 3.30* 3.52*  HCT 29.7* 31.7*  PLT 225 250    Basename 02/20/12 0600 02/19/12 0508  NA 135 139  K 3.8 4.8  CL 98 101  CO2 30 29  BUN 11 17  CREATININE 0.77 0.89  GLUCOSE 121* 101*  CALCIUM 8.7 8.5    Basename 02/20/12 0600 02/19/12 0508  LABPT -- --  INR 1.56* 1.08    Assessment/Plan: Pt amb in room - needs cpm 2 hours per 8 - cont PT today - possible dc fri or sat  DEAN,GREGORY SCOTT 02/20/2012, 7:47 AM

## 2012-02-20 NOTE — Progress Notes (Signed)
RT Note: placed pt on CPAP 6cmh20 with a full face mask. Pt tolerating well. RT and RN will continue to monitor.

## 2012-02-20 NOTE — Progress Notes (Signed)
PT Treatment Note:   02/20/12 1231  PT Visit Information  Last PT Received On 02/20/12  Assistance Needed +1  PT Time Calculation  PT Start Time 1200  PT Stop Time 1225  PT Time Calculation (min) 25 min  Subjective Data  Subjective "I am diong much better."  Patient Stated Goal To go home  Precautions  Precautions Fall;Knee  Precaution Booklet Issued No  Required Braces or Orthoses Knee Immobilizer - Right  Knee Immobilizer - Right On when out of bed or walking  Restrictions  Weight Bearing Restrictions Yes  RLE Weight Bearing WBAT  Cognition  Overall Cognitive Status Appears within functional limits for tasks assessed/performed  Arousal/Alertness Awake/alert  Orientation Level Appears intact for tasks assessed  Behavior During Session [facility] for tasks performed  Bed Mobility  Bed Mobility Sit to Supine  Sit to Supine 4: Min assist;HOB flat  Details for Bed Mobility Assistance Assist for right LE due to pain. Cues for sequence.  Transfers  Transfers Sit to Stand;Stand to Sit (2 trials.)  Sit to Stand 5: Supervision;With upper extremity assist;From chair/3-in-1  Stand to Sit 5: Supervision;With upper extremity assist;To chair/3-in-1;To bed  Details for Transfer Assistance Verbal cues for safe hand placement and slow descent.  Ambulation/Gait  Ambulation/Gait Assistance 4: Min guard  Ambulation Distance (Feet) 120 Feet  Assistive device Rolling walker  Ambulation/Gait Assistance Details Guarding for balance with cues for tall posture and attempts at step-through sequence.  Gait Pattern Step-to pattern;Step-through pattern;Decreased step length - right;Decreased stance time - right;Trunk flexed  Stairs No  Wheelchair Mobility  Wheelchair Mobility No  Balance  Balance Assessed No  PT - End of Session  Equipment Utilized During Treatment Gait belt;Right knee immobilizer  Activity Tolerance Patient tolerated treatment well  Patient left in bed;in CPM;with call bell/phone  within reach (CPM 0-40 degrees.)  Nurse Communication Mobility status  PT - Assessment/Plan  Comments on Treatment Session Pt admitted s/p right TKA with AMS developing post-op with respiratory issues. Medical issues resolved. Pt very motivated and continues to progress able to increase ambulation distance/independence this session.  PT Plan Discharge plan remains appropriate;Frequency remains appropriate  PT Frequency 7X/week  Follow Up Recommendations Home health PT  Equipment Recommended None recommended by PT  Acute Rehab PT Goals  PT Goal Formulation With patient  Time For Goal Achievement 03/04/12  Potential to Achieve Goals Good  PT Goal: Sit to Stand - Progress Progressing toward goal  PT Goal: Stand to Sit - Progress Progressing toward goal  PT Goal: Ambulate - Progress Progressing toward goal  PT General Charges  $$ ACUTE PT VISIT 1 Procedure  PT Treatments  $Gait Training 8-22 mins  $Therapeutic Activity 8-22 mins    Pain:  3/10 in right knee. Pt repositioned.  02/20/2012 Mercedes Carter, PT, DPT [phone]

## 2012-02-20 NOTE — Progress Notes (Signed)
Occupational Therapy Treatment Patient Details Name: Mercedes Carter MRN: [id number] DOB: [birth year] Today's Date: 02/20/2012 Time: 7829-5621 OT Time Calculation (min): 14 min  OT Assessment / Plan / Recommendation Comments on Treatment Session Overall pt progressing well with mobility and ADL's. Will attempt tub transfer and LB dressing next session.     Follow Up Recommendations  No OT follow up;Supervision/Assistance - 24 hour    Barriers to Discharge       Equipment Recommendations  Tub/shower bench    Recommendations for Other Services    Frequency Min 2X/week   Plan Discharge plan remains appropriate    Precautions / Restrictions Precautions Precautions: Fall;Knee Precaution Booklet Issued: No Required Braces or Orthoses: Knee Immobilizer - Right Knee Immobilizer - Right: On when out of bed or walking Restrictions Weight Bearing Restrictions: Yes RLE Weight Bearing: Weight bearing as tolerated   Pertinent Vitals/Pain Pt did not state, just received pain meds prior to session     ADL  Eating/Feeding: Performed;Independent Where Assessed - Eating/Feeding: Chair Grooming: Performed;Wash/dry hands;Supervision/safety Where Assessed - Grooming: Supported Copywriter, advertising: Research scientist (life sciences) Method: Sit to Barista: Raised toilet seat with arms (or 3-in-1 over toilet) Toileting - Clothing Manipulation and Hygiene: Performed;Independent Where Assessed - Toileting Clothing Manipulation and Hygiene: Standing Equipment Used: Gait belt;Rolling walker Transfers/Ambulation Related to ADLs: Supervision for transfers and ambulation, demonstrates good hand placement  ADL Comments: Pt ambulated to bathroom to complete toileting and grooming tasks at supervision level. VC's for deep breathing throughout ambulation and ADL session. Pt overall progressing well.     OT Diagnosis:    OT Problem List:   OT Treatment  Interventions:     OT Goals Acute Rehab OT Goals OT Goal Formulation: With patient Time For Goal Achievement: 03/05/12 Potential to Achieve Goals: Good ADL Goals Pt Will Perform Grooming: with supervision;Standing at sink ADL Goal: Grooming - Progress: Met Pt Will Perform Lower Body Bathing: with supervision;Sit to stand from chair;Sit to stand from bed Pt Will Perform Lower Body Dressing: with supervision;Sit to stand from chair;Sit to stand from bed Pt Will Transfer to Toilet: with supervision;Ambulation;3-in-1 ADL Goal: Toilet Transfer - Progress: Met Pt Will Perform Toileting - Clothing Manipulation: with supervision;Standing ADL Goal: Toileting - Clothing Manipulation - Progress: Met Pt Will Perform Tub/Shower Transfer: with min assist;with DME;Ambulation  Visit Information  Last OT Received On: 02/20/12 Assistance Needed: +1    Subjective Data      Prior Functioning       Cognition  Overall Cognitive Status: Appears within functional limits for tasks assessed/performed Arousal/Alertness: Awake/alert Orientation Level: Appears intact for tasks assessed Behavior During Session: [facility] for tasks performed    Mobility  Bed Mobility Bed Mobility: Not assessed Supine to Sit: 4: Min assist Details for Bed Mobility Assistance: Assist for right LE with cues for sequence and safety. Transfers Transfers: Sit to Stand;Stand to Sit Sit to Stand: 5: Supervision;With upper extremity assist;From chair/3-in-1 Stand to Sit: 5: Supervision;With upper extremity assist;To chair/3-in-1 Details for Transfer Assistance: Safe hand placement this session, cues for deep breathing        Exercises  Total Joint Exercises Ankle Circles/Pumps: AROM;Right;10 reps;Supine Quad Sets: AROM;Right;10 reps;Supine Heel Slides: AAROM;Right;10 reps;Supine Hip ABduction/ADduction: AAROM;Right;10 reps;Supine Straight Leg Raises: AAROM;Right;10 reps;Supine Goniometric ROM: AA/ROM right knee 0-30  degrees.   Balance Balance Balance Assessed: No   End of Session OT - End of Session Equipment Utilized During Treatment: Right knee immobilizer;Gait belt Activity Tolerance: Patient tolerated treatment well  Patient left: in chair;with call bell/phone within reach Nurse Communication: Mobility status  GO     Cleora Fleet 02/20/2012, 11:46 AM

## 2012-02-21 LAB — CBC
Hemoglobin: 9.3 g/dL — ABNORMAL LOW (ref 12.0–15.0)
MCH: 30.3 pg (ref 26.0–34.0)
MCHC: 33.6 g/dL (ref 30.0–36.0)

## 2012-02-21 LAB — PROTIME-INR: Prothrombin Time: 19.2 seconds — ABNORMAL HIGH (ref 11.6–15.2)

## 2012-02-21 LAB — HEMOGLOBIN A1C: Hgb A1c MFr Bld: 5.6 % (ref <5.7)

## 2012-02-21 MED ORDER — METHOCARBAMOL 500 MG PO TABS: 500.0000 mg | ORAL_TABLET | Freq: Four times a day (QID) | ORAL | Status: DC | PRN

## 2012-02-21 MED ORDER — WARFARIN SODIUM 5 MG PO TABS
5.0000 mg | ORAL_TABLET | Freq: Once | ORAL | Status: AC
  Administered 2012-02-21: 5 mg via ORAL
  Filled 2012-02-21: qty 1

## 2012-02-21 MED ORDER — WARFARIN SODIUM 5 MG PO TABS: 5.0000 mg | ORAL_TABLET | Freq: Every day | ORAL | Status: DC

## 2012-02-21 MED ORDER — DSS 100 MG PO CAPS: 100.0000 mg | ORAL_CAPSULE | Freq: Two times a day (BID) | ORAL | Status: DC

## 2012-02-21 MED ORDER — HYDROCODONE-ACETAMINOPHEN 7.5-325 MG PO TABS: 1.0000 | ORAL_TABLET | Freq: Four times a day (QID) | ORAL | Status: DC

## 2012-02-21 NOTE — Progress Notes (Signed)
Subjective: Pt stable - slow to mobilize - cpm 40   Objective: Vital signs in last 24 hours: Temp:  [98 F (36.7 C)-99.3 F (37.4 C)] 98.5 F (36.9 C) (11/15 0527) Pulse Rate:  [94-102] 94  (11/15 0527) Resp:  [18-20] 20  (11/15 0527) BP: (116-131)/(56-77) 131/63 mmHg (11/15 0527) SpO2:  [93 %-98 %] 97 % (11/15 0527)  Intake/Output from previous day: 11/14 0701 - 11/15 0700 In: 480 [P.O.:480] Out: -  Intake/Output this shift:    Exam:  Neurovascular intact Sensation intact distally Intact pulses distally Dorsiflexion/Plantar flexion intact  Labs:  Basename 02/21/12 0525 02/20/12 0600 02/19/12 0508 02/18/12 1731  HGB 9.3* 9.9* 10.1* 11.6*    Basename 02/21/12 0525 02/20/12 0600  WBC 6.9 8.6  RBC 3.07* 3.30*  HCT 27.7* 29.7*  PLT 194 225    Basename 02/20/12 0600 02/19/12 0508  NA 135 139  K 3.8 4.8  CL 98 101  CO2 30 29  BUN 11 17  CREATININE 0.77 0.89  GLUCOSE 121* 101*  CALCIUM 8.7 8.5    Basename 02/21/12 0525 02/20/12 0600  LABPT -- --  INR 1.68* 1.56*    Assessment/Plan: Plan dc am - incision ok today - rx on chart   Damarys Speir SCOTT 02/21/2012, 7:34 AM

## 2012-02-21 NOTE — Progress Notes (Signed)
ANTICOAGULATION CONSULT NOTE - Follow Up Consult  Pharmacy Consult for Coumadin Indication: VTE prophylaxis s/p R-TKA  No Known Allergies  Patient Measurements: Height: 5\' 9"  (175.3 cm) Weight: 288 lb 5.8 oz (130.8 kg) IBW/kg (Calculated) : 66.2   Vital Signs: Temp: 98.5 F (36.9 C) (11/15 0527) BP: 131/63 mmHg (11/15 0527) Pulse Rate: 94  (11/15 0527)  Labs:  Basename 02/21/12 0525 02/20/12 0600 02/19/12 0508 02/18/12 1731  HGB 9.3* 9.9* -- --  HCT 27.7* 29.7* 31.7* --  PLT 194 225 250 --  APTT -- -- -- --  LABPROT 19.2* 18.2* 13.9 --  INR 1.68* 1.56* 1.08 --  HEPARINUNFRC -- -- -- --  CREATININE -- 0.77 0.89 0.99  CKTOTAL -- -- -- 470*  CKMB -- -- -- 6.0*  TROPONINI -- -- -- <0.30    Estimated Creatinine Clearance: 111.3 ml/min (by C-G formula based on Cr of 0.77).   Medications:  Prescriptions prior to admission  Medication Sig Dispense Refill  . albuterol (PROVENTIL HFA;VENTOLIN HFA) 108 (90 BASE) MCG/ACT inhaler Inhale 2 puffs into the lungs every 6 (six) hours as needed. For shortness of breath      . levothyroxine (SYNTHROID, LEVOTHROID) 175 MCG tablet Take 175 mcg by mouth daily.        Assessment: 56 y.o. F started on Coumadin for VTE prophylaxis s/p R-TKA on 11/12. INR is rising at 1.68 today. H/H and plts decr slightly. No overt bleeding is reported. Patient was transferred to ICU 11/12 d/t resp distress following pain meds post-op and transferred back to floor 11/13. Mental status is improved.   Goal of Therapy:  INR 2-3 Monitor platelets by anticoagulation protocol: Yes   Plan:  - Coumadin 5mg  PO x 1 again today - Will continue to monitor daily PT/INR - Follow-up CBC.   Link Snuffer, PharmD, BCPS Clinical Pharmacist [phone] 02/21/2012 10:07 AM

## 2012-02-21 NOTE — Progress Notes (Signed)
CARE MANAGEMENT NOTE 02/21/2012  Patient:  Mercedes Carter, Mercedes Carter   Account Number:  [id number]  Date Initiated:  02/18/2012  Documentation initiated by:  Vance Peper  Subjective/Objective Assessment:   56 yr old female s/p right total knee arthroplasty     Action/Plan:   Patient preoperatively setup with [facility], no changes. CPM, rolling walker and 3in1 have been delivered to the pt's home.   Anticipated DC Date:  02/22/2012   Anticipated DC Plan:  HOME W HOME HEALTH SERVICES      DC Planning Services  CM consult      [facility] Choice  HOME HEALTH   Choice offered to / List presented to:  C-1 Patient      DME agency  [employer]     HH arranged  HH-2 PT      [facility] agency  [facility]   Status of service:  Completed, signed off Medicare Important Message given?   (If response is "NO", the following Medicare IM given date fields will be blank) Date Medicare IM given:   Date Additional Medicare IM given:    Discharge Disposition:  HOME W HOME HEALTH SERVICES  Per UR Regulation:    If discussed at Long Length of Stay Meetings, dates discussed:    Comments:

## 2012-02-21 NOTE — Progress Notes (Signed)
Physical Therapy Treatment Patient Details Name: Mercedes Carter MRN: [id number] DOB: 1955/05/29 Today's Date: 02/21/2012 Time: 4696-2952 PT Time Calculation (min): 35 min  PT Assessment / Plan / Recommendation Comments on Treatment Session  Pt admitted s/p right TKA and is very motivated to work with therapy. Pt able to increase ambulation distance significantly with great tolerance to mobility this pm.    Follow Up Recommendations  Home health PT     Does the patient have the potential to tolerate intense rehabilitation     Barriers to Discharge        Equipment Recommendations  None recommended by PT    Recommendations for Other Services    Frequency 7X/week   Plan Discharge plan remains appropriate;Frequency remains appropriate    Precautions / Restrictions Precautions Precautions: Fall;Knee Precaution Booklet Issued: No Required Braces or Orthoses: Knee Immobilizer - Right Knee Immobilizer - Right: On when out of bed or walking Restrictions Weight Bearing Restrictions: Yes RLE Weight Bearing: Weight bearing as tolerated   Pertinent Vitals/Pain None. Pt premedicated.    Mobility  Bed Mobility Bed Mobility: Supine to Sit;Sit to Supine Supine to Sit: 4: Min assist;HOB flat Sit to Supine: 5: Supervision;HOB flat Details for Bed Mobility Assistance: Assist for right LE during supine to sit. Educated pt on using sheet to assist right LE and pt supervision with cues for sequence for sit to supine. Transfers Transfers: Sit to Stand;Stand to Sit (2 trials.) Sit to Stand: 6: Modified independent (Device/Increase time);With upper extremity assist;From bed;From chair/3-in-1 Stand to Sit: 6: Modified independent (Device/Increase time);With upper extremity assist;To chair/3-in-1;To bed Ambulation/Gait Ambulation/Gait Assistance: 4: Min guard Ambulation Distance (Feet): 300 Feet Assistive device: Rolling walker Ambulation/Gait Assistance Details: Guarding for balance with cues  for continued attempts at step-through sequence. Able to perform some step-through, but resorts to step-to with fatigue. Gait Pattern: Step-to pattern;Step-through pattern;Decreased step length - right;Decreased stance time - right;Trunk flexed Gait velocity: slowed Stairs: No Wheelchair Mobility Wheelchair Mobility: No    Exercises     PT Diagnosis:    PT Problem List:   PT Treatment Interventions:     PT Goals Acute Rehab PT Goals PT Goal Formulation: With patient Time For Goal Achievement: 03/04/12 Potential to Achieve Goals: Good PT Goal: Sit to Stand - Progress: Met PT Goal: Stand to Sit - Progress: Met PT Goal: Ambulate - Progress: Progressing toward goal  Visit Information  Last PT Received On: 02/21/12 Assistance Needed: +1    Subjective Data  Subjective: "Doing much better!" Patient Stated Goal: To go home   Cognition  Overall Cognitive Status: Appears within functional limits for tasks assessed/performed Arousal/Alertness: Awake/alert Orientation Level: Appears intact for tasks assessed Behavior During Session: [facility] for tasks performed    Balance  Balance Balance Assessed: No  End of Session PT - End of Session Equipment Utilized During Treatment: Gait belt;Right knee immobilizer Activity Tolerance: Patient tolerated treatment well Patient left: in bed;with call bell/phone within reach Nurse Communication: Mobility status   GP     Cephus Shelling 02/21/2012, 1:37 PM  02/21/2012 Cephus Shelling, PT, DPT [phone]

## 2012-02-21 NOTE — Progress Notes (Signed)
Physical Therapy Treatment Patient Details Name: Mercedes Carter MRN: [id number] DOB: [birth year] Today's Date: 02/21/2012 Time: 4098-1191 PT Time Calculation (min): 24 min  PT Assessment / Plan / Recommendation Comments on Treatment Session  Pt admitted s/p right TKA and continues to progress with therapy. Pt very motivated, but limited by knee pain this am. Able to tolerate slight ambulation distance.    Follow Up Recommendations  Home health PT     Does the patient have the potential to tolerate intense rehabilitation     Barriers to Discharge        Equipment Recommendations  None recommended by PT    Recommendations for Other Services    Frequency 7X/week   Plan Discharge plan remains appropriate;Frequency remains appropriate    Precautions / Restrictions Precautions Precautions: Fall;Knee Precaution Booklet Issued: No Required Braces or Orthoses: Knee Immobilizer - Right Knee Immobilizer - Right: On when out of bed or walking Restrictions Weight Bearing Restrictions: Yes RLE Weight Bearing: Weight bearing as tolerated   Pertinent Vitals/Pain 7/10 in right knee. RN aware and medication given. Pt also repositioned.    Mobility  Bed Mobility Bed Mobility: Supine to Sit Sit to Supine: 4: Min guard Details for Bed Mobility Assistance: Guarding for balance with cues for safe sequence. Transfers Transfers: Sit to Stand;Stand to Sit (2 trials.) Sit to Stand: 5: Supervision;With upper extremity assist;From chair/3-in-1 Stand to Sit: 5: Supervision;With upper extremity assist;To chair/3-in-1 Details for Transfer Assistance: Verbal cues for safest hand/right LE placement. Ambulation/Gait Ambulation/Gait Assistance: 4: Min guard Ambulation Distance (Feet): 50 Feet Assistive device: Rolling walker Ambulation/Gait Assistance Details: Guarding for balance with cues for attempts at step-through sequence. Distance limited by pain this am. Gait Pattern: Step-to  pattern;Step-through pattern;Decreased step length - right;Decreased stance time - right;Trunk flexed Gait velocity: slowed Stairs: No Wheelchair Mobility Wheelchair Mobility: No    Exercises Total Joint Exercises Ankle Circles/Pumps: AROM;Right;10 reps;Supine Quad Sets: AROM;Right;10 reps;Supine Heel Slides: AAROM;Right;10 reps;Supine Goniometric ROM: AA/ROM right knee 0-40 degrees.   PT Diagnosis:    PT Problem List:   PT Treatment Interventions:     PT Goals Acute Rehab PT Goals PT Goal Formulation: With patient Time For Goal Achievement: 03/04/12 Potential to Achieve Goals: Good PT Goal: Sit to Stand - Progress: Progressing toward goal PT Goal: Stand to Sit - Progress: Progressing toward goal PT Goal: Ambulate - Progress: Progressing toward goal PT Goal: Perform Home Exercise Program - Progress: Progressing toward goal  Visit Information  Last PT Received On: 02/21/12 Assistance Needed: +1    Subjective Data  Subjective: "I am having a rough morning." Patient Stated Goal: To go home   Cognition  Overall Cognitive Status: Appears within functional limits for tasks assessed/performed Arousal/Alertness: Awake/alert Orientation Level: Appears intact for tasks assessed Behavior During Session: [facility] for tasks performed    Balance  Balance Balance Assessed: No  End of Session PT - End of Session Equipment Utilized During Treatment: Gait belt;Right knee immobilizer Activity Tolerance: Patient tolerated treatment well Patient left: in chair;with call bell/phone within reach Nurse Communication: Mobility status   GP     Cephus Shelling 02/21/2012, 9:41 AM  02/21/2012 Cephus Shelling, PT, DPT [phone]

## 2012-02-22 LAB — GLUCOSE, CAPILLARY: Glucose-Capillary: 114 mg/dL — ABNORMAL HIGH (ref 70–99)

## 2012-02-22 MED ORDER — DIPHENHYDRAMINE HCL 25 MG PO CAPS: 25.0000 mg | ORAL_CAPSULE | Freq: Four times a day (QID) | ORAL | Status: DC | PRN

## 2012-02-22 MED ORDER — WARFARIN SODIUM 7.5 MG PO TABS
7.5000 mg | ORAL_TABLET | Freq: Once | ORAL | Status: AC
  Administered 2012-02-22: 7.5 mg via ORAL
  Filled 2012-02-22: qty 1

## 2012-02-22 NOTE — Progress Notes (Signed)
Patient ID: Mercedes Carter, female   DOB: May 15, 1955, 56 y.o.   MRN: [id number] Postoperative day for right total knee arthroplasty. Patient states she is comfortable this morning. Plan for physical therapy with stair training today. Anticipate discharge tomorrow.

## 2012-02-22 NOTE — Progress Notes (Signed)
Occupational Therapy Treatment Patient Details Name: Mercedes Carter MRN: [id number] DOB: 1956/03/26 Today's Date: 02/22/2012 Time: 4098-1191 OT Time Calculation (min): 9 min   Equipment Recommendations  None recommended by PT       Frequency Min 2X/week   Plan Discharge plan remains appropriate    Precautions / Restrictions Precautions Precautions: Fall;Knee Precaution Booklet Issued: No Required Braces or Orthoses: Knee Immobilizer - Right Knee Immobilizer - Right: On when out of bed or walking Restrictions Weight Bearing Restrictions: Yes RLE Weight Bearing: Weight bearing as tolerated       ADL  Lower Body Dressing: Maximal assistance;Other (comment) (instructed in safe and functional technique. Pt has reacher) Tub/Shower Transfer: Performed;Minimal Engineer, maintenance (IT) Transfers/Ambulation Related to ADLs: Pt walked toward tub with walker and did practice tub bench transfer.  Pts family will obtain tub bench. Educated pt on obtaining tub bench that does support 300 pounds for safety ADL Comments: OT demonstrated use of tub bench in pts room. Educated pt on safety and functional use of tub bench. Family will obtain tub bench for pt.      OT Goals ADL Goals ADL Goal: Lower Body Dressing - Progress: Progressing toward goals Pt Will Perform Tub/Shower Transfer: with min assist;with DME ADL Goal: Tub/Shower Transfer - Progress: Met  Visit Information  Last OT Received On: 02/22/12 Assistance Needed: +1 Reason Eval/Treat Not Completed: Other (comment) (pt became dizzy with attempted sit to stand.  RN aware)    Subjective Data  Subjective: Pt in gym with PT feeling better. Agreed to tub transfer      Cognition  Overall Cognitive Status: Appears within functional limits for tasks assessed/performed Arousal/Alertness: Awake/alert Orientation Level: Appears intact for tasks assessed Behavior During Session: [facility] for tasks performed     Mobility  Shoulder Instructions  Transfers: Sit to Stand Sit to Stand: 4: Min guard;With upper extremity assist;From bed;From toilet;From chair/3-in-1 Stand to Sit: 4: Min guard;With upper extremity assist;To toilet;To chair/3-in-1;To bed Details for Transfer Assistance: Guarding for balance due to dizziness episode earlier.           Balance Balance Balance Assessed: No   End of Session OT - End of Session Equipment Utilized During Treatment: Gait belt;Right knee immobilizer Activity Tolerance: Patient tolerated treatment well Patient left: Other (comment) (with PT ambulating in hall)       Alba Cory 02/22/2012, 2:39 PM

## 2012-02-22 NOTE — Progress Notes (Signed)
ANTICOAGULATION CONSULT NOTE - Follow Up Consult  Pharmacy Consult for Coumadin Indication: VTE prophylaxis s/p R-TKA  No Known Allergies  Patient Measurements: Height: 5\' 9"  (175.3 cm) Weight: 288 lb 5.8 oz (130.8 kg) IBW/kg (Calculated) : 66.2   Vital Signs: BP: 126/52 mmHg (11/16 1122) Pulse Rate: 90  (11/16 1122)  Labs:  Basename 02/22/12 0438 02/21/12 0525 02/20/12 0600  HGB -- 9.3* 9.9*  HCT -- 27.7* 29.7*  PLT -- 194 225  APTT -- -- --  LABPROT 20.2* 19.2* 18.2*  INR 1.79* 1.68* 1.56*  HEPARINUNFRC -- -- --  CREATININE -- -- 0.77  CKTOTAL -- -- --  CKMB -- -- --  TROPONINI -- -- --    Estimated Creatinine Clearance: 111.3 ml/min (by C-G formula based on Cr of 0.77).   Medications:  Scheduled:    . coumadin book   Does not apply Once  . docusate sodium  100 mg Oral BID  . HYDROcodone-acetaminophen  1 tablet Oral Q6H  . levothyroxine  175 mcg Oral QAC breakfast  . [COMPLETED] warfarin  5 mg Oral ONCE-1800  . warfarin   Does not apply Once  . Warfarin - Pharmacist Dosing Inpatient   Does not apply q1800    Assessment: 56 y.o. F started on Coumadin for VTE prophylaxis s/p R-TKA on 11/12. INR is rising at 1.79 today. H/H and plts decr slightly. No overt bleeding is reported. Patient was transferred to ICU 11/12 d/t resp distress following pain meds post-op and transferred back to floor 11/13.   Goal of Therapy:  INR 2-3   Plan:  - Coumadin 7.5mg  PO x 1 tonight at 1800  - Will continue to monitor daily PT/INR  - Follow-up CBC.   Lillia Pauls, PharmD Clinical Pharmacist Pager: [phone] Phone: [phone] 02/22/2012 11:45 AM

## 2012-02-22 NOTE — Progress Notes (Signed)
Occupational Therapy Treatment Patient Details Name: Laurelle Skiver MRN: [id number] DOB: June 21, 1955 Today's Date: 02/22/2012 Time: 4098-1191 OT Time Calculation (min): 26 min   Equipment Recommendations  None recommended by OT;Other (comment) (family will obtain tub bench)       Frequency Min 2X/week   Plan Discharge plan remains appropriate    Precautions / Restrictions Precautions Precautions: Fall;Knee Precaution Booklet Issued: No Required Braces or Orthoses: Knee Immobilizer - Right Knee Immobilizer - Right: On when out of bed or walking Restrictions Weight Bearing Restrictions: Yes RLE Weight Bearing: Weight bearing as tolerated       ADL  Lower Body Dressing: Maximal assistance;Other (comment) (instructed in safe and functional technique. Pt has reacher) Transfers/Ambulation Related to ADLs: Upon attempted sit to stand pt became very dizzy and sat in recliner behind her quickly.  BP taken and it was elevated. RN aware ADL Comments: OT demonstrated use of tub bench in pts room. Educated pt on safety and functional use of tub bench. Family will obtain tub bench for pt.      OT Goals ADL Goals ADL Goal: Lower Body Dressing - Progress: Progressing toward goals ADL Goal: Tub/Shower Transfer - Progress: Progressing toward goals  Visit Information  Last OT Received On: 02/22/12 Assistance Needed: +1 Reason Eval/Treat Not Completed: Other (comment) (pt became dizzy with attempted sit to stand.  RN aware)    Subjective Data  Subjective: i got dizzy i was trying to stand and just couldnt do it. (pt sat very quickly and uncontrolled in chair behind her) RN aware      Cognition  Overall Cognitive Status: Appears within functional limits for tasks assessed/performed Arousal/Alertness: Awake/alert Orientation Level: Appears intact for tasks assessed Behavior During Session: [facility] for tasks performed    Mobility  Shoulder Instructions Transfers Transfers: Sit to  Stand Sit to Stand: 4: Min assist Stand to Sit: 3: Mod assist Details for Transfer Assistance: verbal cues needed for UE and LE hand placement             End of Session OT - End of Session Activity Tolerance: Treatment limited secondary to medical complications (Comment);Other (comment) (dizziness)  GO     Alba Cory 02/22/2012, 12:23 PM

## 2012-02-22 NOTE — Progress Notes (Signed)
PT Treatment Note:   02/22/12 1349  PT Visit Information  Last PT Received On 02/22/12  Assistance Needed +1  PT/OT Co-Evaluation/Treatment Yes (Partial)  PT Time Calculation  PT Start Time 1330  PT Stop Time 1408  PT Time Calculation (min) 38 min  Subjective Data  Subjective "I'm doing alright this afternoon."  Patient Stated Goal To go home  Precautions  Precautions Fall;Knee  Precaution Booklet Issued No  Required Braces or Orthoses Knee Immobilizer - Right  Knee Immobilizer - Right On when out of bed or walking  Restrictions  Weight Bearing Restrictions Yes  RLE Weight Bearing WBAT  Cognition  Overall Cognitive Status Appears within functional limits for tasks assessed/performed  Arousal/Alertness Awake/alert  Orientation Level Appears intact for tasks assessed  Behavior During Session [facility] for tasks performed  Bed Mobility  Bed Mobility Supine to Sit  Supine to Sit 4: Min assist;HOB flat  Details for Bed Mobility Assistance Assist for right LE due to pain and weakness. Cues for sequence.  Transfers  Transfers Sit to Stand;Stand to Sit (3 trials.)  Sit to Stand 4: Min guard;With upper extremity assist;From bed;From toilet;From chair/3-in-1  Stand to Sit 4: Min guard;With upper extremity assist;To toilet;To chair/3-in-1;To bed  Details for Transfer Assistance Guarding for balance due to dizziness episode earlier.   Ambulation/Gait  Ambulation/Gait Assistance 4: Min guard  Ambulation Distance (Feet) 100 Feet  Assistive device Rolling walker  Ambulation/Gait Assistance Details Guarding for balance due to dizziness episode earlier. Cues for safest.  Gait Pattern Step-to pattern;Step-through pattern;Decreased step length - right;Decreased stance time - right;Antalgic;Trunk flexed  Gait velocity slowed  Stairs Yes  Stairs Assistance 4: Min assist  Stairs Assistance Details (indicate cue type and reason) Assist for balance to off weight right LE due to pain. Cues for "up  with good, down with bad."  Stair Management Technique One rail Right;Step to pattern;Sideways  Number of Stairs 2   Wheelchair Mobility  Wheelchair Mobility No  Balance  Balance Assessed No  PT - End of Session  Equipment Utilized During Treatment Gait belt;Right knee immobilizer  Activity Tolerance Patient tolerated treatment well  Patient left in bed;in CPM;with call bell/phone within reach;with family/visitor present (CPM 0-40 degrees.)  Nurse Communication Mobility status  PT - Assessment/Plan  Comments on Treatment Session Pt admitted s/p right TKA and was able to tolerate increased ambulation distance this pm. Pt also able to negotiate stairs this pm. Motivated!  PT Plan Discharge plan remains appropriate;Frequency remains appropriate  PT Frequency 7X/week  Follow Up Recommendations Home health PT  Equipment Recommended None recommended by PT  Acute Rehab PT Goals  PT Goal Formulation With patient  Time For Goal Achievement 03/04/12  Potential to Achieve Goals Good  PT Goal: Sit to Stand - Progress Progressing toward goal  PT Goal: Stand to Sit - Progress Progressing toward goal  PT Goal: Ambulate - Progress Progressing toward goal  Pt will Go Up / Down Stairs 3-5 stairs;with supervision;with least restrictive assistive device  PT Goal: Up/Down Stairs - Progress Goal set today  PT General Charges  $$ ACUTE PT VISIT 1 Procedure  PT Treatments  $Gait Training 23-37 mins  $Therapeutic Activity 8-22 mins    Pain:  4/10 in right knee. Pt repositioned.  02/22/2012 Mercedes Carter, PT, DPT [phone]

## 2012-02-22 NOTE — Progress Notes (Signed)
Physical Therapy Treatment Patient Details Name: Mercedes Carter MRN: [id number] DOB: 02-21-1956 Today's Date: 02/22/2012 Time: 4098-1191 PT Time Calculation (min): 25 min  PT Assessment / Plan / Recommendation Comments on Treatment Session  Pt admitted s/p right TKA and continues to progress with therapy. Pt able to increase independence with mobility although ambulation distance is slightly limited due to pain this am. Will trial stairs this pm as able.    Follow Up Recommendations  Home health PT     Does the patient have the potential to tolerate intense rehabilitation     Barriers to Discharge        Equipment Recommendations  None recommended by PT    Recommendations for Other Services    Frequency 7X/week   Plan Discharge plan remains appropriate;Frequency remains appropriate    Precautions / Restrictions Precautions Precautions: Fall;Knee Precaution Booklet Issued: No Required Braces or Orthoses: Knee Immobilizer - Right Knee Immobilizer - Right: On when out of bed or walking Restrictions Weight Bearing Restrictions: Yes RLE Weight Bearing: Weight bearing as tolerated   Pertinent Vitals/Pain 8/10 in right knee. Pt premedicated and repositioned.    Mobility  Bed Mobility Bed Mobility: Supine to Sit Supine to Sit: 4: Min assist;HOB flat Details for Bed Mobility Assistance: Assist for right LE due to pain. Cues for sequence. Transfers Transfers: Sit to Stand;Stand to Sit Sit to Stand: 6: Modified independent (Device/Increase time);With upper extremity assist;From bed Stand to Sit: 6: Modified independent (Device/Increase time);With upper extremity assist;To chair/3-in-1 Ambulation/Gait Ambulation/Gait Assistance: 5: Supervision Ambulation Distance (Feet): 50 Feet Assistive device: Rolling walker Ambulation/Gait Assistance Details: Verbal cues for safe sequence, tall posture, and continued attempts at step-through sequence. Gait Pattern: Step-to  pattern;Step-through pattern;Decreased step length - right;Decreased stance time - right;Antalgic;Trunk flexed Gait velocity: slowed Stairs: No Wheelchair Mobility Wheelchair Mobility: No    Exercises Total Joint Exercises Ankle Circles/Pumps: AROM;Right;10 reps;Supine Quad Sets: AROM;Right;10 reps;Supine Heel Slides: AAROM;Right;10 reps;Supine Hip ABduction/ADduction: AAROM;Right;10 reps;Supine Straight Leg Raises: AAROM;Right;10 reps;Supine Goniometric ROM: AA/ROM right knee 0-40 degrees.   PT Diagnosis:    PT Problem List:   PT Treatment Interventions:     PT Goals Acute Rehab PT Goals PT Goal Formulation: With patient Time For Goal Achievement: 03/04/12 Potential to Achieve Goals: Good PT Goal: Sit to Stand - Progress: Met PT Goal: Stand to Sit - Progress: Met PT Goal: Ambulate - Progress: Progressing toward goal PT Goal: Perform Home Exercise Program - Progress: Progressing toward goal  Visit Information  Last PT Received On: 02/22/12 Assistance Needed: +1    Subjective Data  Subjective: "The mornings just seem to be bad for me." Patient Stated Goal: To go home   Cognition  Overall Cognitive Status: Appears within functional limits for tasks assessed/performed Arousal/Alertness: Awake/alert Orientation Level: Appears intact for tasks assessed Behavior During Session: [facility] for tasks performed    Balance  Balance Balance Assessed: No  End of Session PT - End of Session Equipment Utilized During Treatment: Gait belt;Right knee immobilizer Activity Tolerance: Patient tolerated treatment well Patient left: in chair;with call bell/phone within reach Nurse Communication: Mobility status   GP     Cephus Shelling 02/22/2012, 8:16 AM  02/22/2012 Cephus Shelling, PT, DPT [phone]

## 2012-02-23 LAB — PROTIME-INR
INR: 1.89 — ABNORMAL HIGH (ref 0.00–1.49)
Prothrombin Time: 21 seconds — ABNORMAL HIGH (ref 11.6–15.2)

## 2012-02-23 NOTE — Plan of Care (Signed)
Problem: Discharge Progression Outcomes Goal: Anticoagulant follow-up in place Outcome: Completed/Met Date Met:  02/23/12 [facility].

## 2012-02-23 NOTE — Discharge Summary (Signed)
Physician Discharge Summary  Patient ID: Mercedes Carter MRN: [id number] DOB/AGE: [birth year] 58 y.o.  Admit date: 02/18/2012 Discharge date: 02/23/2012  Admission Diagnoses: Osteoarthritis right knee  Discharge Diagnoses: Same Principal Problem:  *Respiratory failure, post-operative Active Problems:  Hypercarbia  Asthma  Obesity  Altered mental state  Hypothyroid  OSA (obstructive sleep apnea)  Total knee replacement status   Discharged Condition: stable  Hospital Course: Patient's hospital course was essentially unremarkable. She underwent a right total knee arthroplasty. Postoperatively patient progressed slowly with therapy but was safe for discharge to home with home health therapy.  Consults: None  Significant Diagnostic Studies: labs: Routine labs  Treatments: surgery: See operative note  Discharge Exam: Blood pressure 132/79, pulse 91, temperature 98.5 F (36.9 C), temperature source Oral, resp. rate 18, height 5\' 9"  (1.753 m), weight 130.8 kg (288 lb 5.8 oz), SpO2 98.00%. Incision/Wound: incision clean and dry at time of discharge  Disposition:   Discharge Orders    Future Orders Please Complete By Expires   Diet - low sodium heart healthy      Diet - low sodium heart healthy      Call MD / Call 911      Comments:   If you experience chest pain or shortness of breath, CALL 911 and be transported to the hospital emergency room.  If you develope a fever above 101 F, pus (white drainage) or increased drainage or redness at the wound, or calf pain, call your surgeon's office.   Constipation Prevention      Comments:   Drink plenty of fluids.  Prune juice may be helpful.  You may use a stool softener, such as Colace (over the counter) 100 mg twice a day.  Use MiraLax (over the counter) for constipation as needed.   Increase activity slowly as tolerated      Discharge instructions      Comments:   1. Weight bearing as tolerated with crutches right leg 2. CPM use  2 hours per 8 daily   Increase degrees daily also 3. Keep incision dry   Call MD / Call 911      Comments:   If you experience chest pain or shortness of breath, CALL 911 and be transported to the hospital emergency room.  If you develope a fever above 101 F, pus (white drainage) or increased drainage or redness at the wound, or calf pain, call your surgeon's office.   Constipation Prevention      Comments:   Drink plenty of fluids.  Prune juice may be helpful.  You may use a stool softener, such as Colace (over the counter) 100 mg twice a day.  Use MiraLax (over the counter) for constipation as needed.   Increase activity slowly as tolerated          Medication List     As of 02/23/2012  7:05 AM    TAKE these medications         albuterol 108 (90 BASE) MCG/ACT inhaler   Commonly known as: PROVENTIL HFA;VENTOLIN HFA   Inhale 2 puffs into the lungs every 6 (six) hours as needed. For shortness of breath      DSS 100 MG Caps   Take 100 mg by mouth 2 (two) times daily.      HYDROcodone-acetaminophen 7.5-325 MG per tablet   Commonly known as: NORCO   Take 1 tablet by mouth every 6 (six) hours.      levothyroxine 175 MCG tablet   Commonly known  as: SYNTHROID, LEVOTHROID   Take 175 mcg by mouth daily.      methocarbamol 500 MG tablet   Commonly known as: ROBAXIN   Take 1 tablet (500 mg total) by mouth every 6 (six) hours as needed.      warfarin 5 MG tablet   Commonly known as: COUMADIN   Take 1 tablet (5 mg total) by mouth daily.         Signed: DUDA,MARCUS V 02/23/2012, 7:05 AM

## 2012-02-23 NOTE — Care Management Note (Signed)
    Page 1 of 2   02/23/2012     6:35:02 PM   CARE MANAGEMENT NOTE 02/23/2012  Patient:  Mercedes Carter, Mercedes Carter   Account Number:  [id number]  Date Initiated:  02/18/2012  Documentation initiated by:  Vance Peper  Subjective/Objective Assessment:   56 yr old female s/p right total knee arthroplasty     Action/Plan:   Patient preoperatively setup with [facility], no changes. CPM, rolling walker and 3in1 have been delivered to the pt's home.   Anticipated DC Date:  02/22/2012   Anticipated DC Plan:  HOME W HOME HEALTH SERVICES      DC Planning Services  CM consult      [facility] Choice  HOME HEALTH   Choice offered to / List presented to:  C-1 Patient      DME agency  [employer]     HH arranged  HH-2 PT      [facility] agency  [facility]   Status of service:  Completed, signed off Medicare Important Message given?   (If response is "NO", the following Medicare IM given date fields will be blank) Date Medicare IM given:   Date Additional Medicare IM given:    Discharge Disposition:  HOME W HOME HEALTH SERVICES  Per UR Regulation:    If discussed at Long Length of Stay Meetings, dates discussed:    Comments:  02/23/2012 1630 [facility] aware of pt's d/c home today with HH. Isidoro Donning RN CCM Case Mgmt phone [phone]

## 2012-02-25 ENCOUNTER — Other Ambulatory Visit (HOSPITAL_COMMUNITY): Payer: Self-pay | Admitting: Orthopedic Surgery

## 2012-02-25 ENCOUNTER — Ambulatory Visit (HOSPITAL_COMMUNITY)
Admission: RE | Admit: 2012-02-25 | Discharge: 2012-02-25 | Disposition: A | Discharge status: Home / Self Care | Payer: BC Managed Care – PPO | Source: Ambulatory Visit | Attending: Orthopedic Surgery | Admitting: Orthopedic Surgery

## 2012-02-25 DIAGNOSIS — M79609 Pain in unspecified limb: Secondary | ICD-10-CM | POA: Insufficient documentation

## 2012-02-25 DIAGNOSIS — J9601 Acute respiratory failure with hypoxia: Secondary | ICD-10-CM

## 2012-02-25 DIAGNOSIS — Z96659 Presence of unspecified artificial knee joint: Secondary | ICD-10-CM | POA: Insufficient documentation

## 2012-02-25 DIAGNOSIS — M7989 Other specified soft tissue disorders: Secondary | ICD-10-CM

## 2012-02-25 DIAGNOSIS — M79606 Pain in leg, unspecified: Secondary | ICD-10-CM

## 2012-02-25 DIAGNOSIS — E669 Obesity, unspecified: Secondary | ICD-10-CM

## 2012-02-25 DIAGNOSIS — J9600 Acute respiratory failure, unspecified whether with hypoxia or hypercapnia: Secondary | ICD-10-CM | POA: Insufficient documentation

## 2012-02-25 NOTE — Progress Notes (Signed)
Right:  No evidence of DVT, superficial thrombosis, or Baker's cyst.  Left:  Negative for DVT in the common femoral vein.  

## 2012-03-19 ENCOUNTER — Encounter: Payer: Self-pay | Admitting: Orthopedic Surgery

## 2012-04-08 ENCOUNTER — Encounter: Payer: Self-pay | Admitting: Orthopedic Surgery

## 2012-05-09 ENCOUNTER — Encounter: Payer: Self-pay | Admitting: Orthopedic Surgery

## 2012-06-06 ENCOUNTER — Encounter: Payer: Self-pay | Admitting: Orthopedic Surgery

## 2015-04-04 ENCOUNTER — Ambulatory Visit
Admission: RE | Admit: 2015-04-04 | Discharge: 2015-04-04 | Disposition: A | Discharge status: Home / Self Care | Payer: BC Managed Care – PPO | Source: Ambulatory Visit | Attending: Family Medicine | Admitting: Family Medicine

## 2015-04-04 ENCOUNTER — Other Ambulatory Visit: Payer: Self-pay | Admitting: Family Medicine

## 2015-04-04 DIAGNOSIS — M5410 Radiculopathy, site unspecified: Secondary | ICD-10-CM | POA: Diagnosis not present

## 2015-04-04 DIAGNOSIS — I700 Atherosclerosis of aorta: Secondary | ICD-10-CM | POA: Diagnosis not present

## 2015-05-18 ENCOUNTER — Other Ambulatory Visit: Payer: Self-pay | Admitting: Orthopedic Surgery

## 2015-05-18 DIAGNOSIS — M545 Low back pain: Secondary | ICD-10-CM

## 2015-06-01 ENCOUNTER — Ambulatory Visit
Admission: RE | Admit: 2015-06-01 | Discharge: 2015-06-01 | Disposition: A | Discharge status: Home / Self Care | Payer: BC Managed Care – PPO | Source: Ambulatory Visit | Attending: Orthopedic Surgery | Admitting: Orthopedic Surgery

## 2015-06-01 DIAGNOSIS — M545 Low back pain: Secondary | ICD-10-CM

## 2015-07-31 ENCOUNTER — Encounter: Payer: Self-pay | Admitting: Internal Medicine

## 2015-09-26 ENCOUNTER — Ambulatory Visit: Payer: BC Managed Care – PPO | Admitting: Internal Medicine

## 2017-04-10 ENCOUNTER — Encounter: Payer: Self-pay | Admitting: Obstetrics and Gynecology

## 2017-04-10 ENCOUNTER — Ambulatory Visit (INDEPENDENT_AMBULATORY_CARE_PROVIDER_SITE_OTHER): Payer: BC Managed Care – PPO

## 2017-04-10 ENCOUNTER — Ambulatory Visit (INDEPENDENT_AMBULATORY_CARE_PROVIDER_SITE_OTHER): Payer: BC Managed Care – PPO | Admitting: Obstetrics and Gynecology

## 2017-04-10 VITALS — BP 136/83 | HR 102 | Ht 67.0 in | Wt 231.5 lb

## 2017-04-10 DIAGNOSIS — Z01419 Encounter for gynecological examination (general) (routine) without abnormal findings: Secondary | ICD-10-CM | POA: Diagnosis not present

## 2017-04-10 DIAGNOSIS — Z1231 Encounter for screening mammogram for malignant neoplasm of breast: Secondary | ICD-10-CM

## 2017-04-10 DIAGNOSIS — R102 Pelvic and perineal pain: Secondary | ICD-10-CM

## 2017-04-10 DIAGNOSIS — Z202 Contact with and (suspected) exposure to infections with a predominantly sexual mode of transmission: Secondary | ICD-10-CM

## 2017-04-10 DIAGNOSIS — Z800 Family history of malignant neoplasm of digestive organs: Secondary | ICD-10-CM

## 2017-04-10 DIAGNOSIS — Z1211 Encounter for screening for malignant neoplasm of colon: Secondary | ICD-10-CM

## 2017-04-10 DIAGNOSIS — Z1239 Encounter for other screening for malignant neoplasm of breast: Secondary | ICD-10-CM

## 2017-04-10 DIAGNOSIS — A630 Anogenital (venereal) warts: Secondary | ICD-10-CM | POA: Diagnosis not present

## 2017-04-10 DIAGNOSIS — E669 Obesity, unspecified: Secondary | ICD-10-CM

## 2017-04-10 DIAGNOSIS — Z8041 Family history of malignant neoplasm of ovary: Secondary | ICD-10-CM

## 2017-04-10 HISTORY — DX: Pelvic and perineal pain: R10.2

## 2017-04-10 HISTORY — DX: Family history of malignant neoplasm of ovary: Z80.41

## 2017-04-10 HISTORY — DX: Contact with and (suspected) exposure to infections with a predominantly sexual mode of transmission: Z20.2

## 2017-04-10 HISTORY — DX: Anogenital (venereal) warts: A63.0

## 2017-04-10 HISTORY — DX: Obesity, unspecified: E66.9

## 2017-04-10 HISTORY — DX: Family history of malignant neoplasm of digestive organs: Z80.0

## 2017-04-10 MED ORDER — IMIQUIMOD 5 % EX CREA: TOPICAL_CREAM | CUTANEOUS | 0 refills | Status: DC

## 2017-04-10 NOTE — Progress Notes (Signed)
ANNUAL PREVENTATIVE CARE GYN  ENCOUNTER NOTE  Subjective:       Mercedes Carter is a 62 y.o. [phone] female here for a routine annual gynecologic exam.  Current complaints: 1.  Cramps/pp- x 1 year  Previous patient of [place]; has not had a physical in approximately 4 years. 4 years ago diagnosed with genital warts, status post excisional therapy. Family history of ovarian cancer in mom diagnosed at age 23, deceased in her 31s; sister diagnosed at age 81, died within 61 weeks of diagnosis.  Patient has not had genetic testing. Patient also has a maternal grandmother who had colon cancer.  Patient has never had colonoscopy.  Bowel function is reportedly normal; no blood in stool or melena. Bladder function is notable for stress incontinence and some nocturia  Major concern over the past 8 months is lower abdominal pelvic pain characterized as burning and throbbing and cramping; she does experience deep thrusting dyspareunia. She also is having post sex discomfort with vulvar vaginal burning and irritation.  This occurs most prominently when condoms are not used. Patient reports the diagnosis of genital warts being made for years ago; husband was noted to be having an affair; she still is within the relationship.  The patient and her spouse were married at age 48; she has remained monogamous.    Gynecologic History No LMP recorded. Patient is postmenopausal.  Menarche-age 23 Menopause-age 60 No history of HRT therapy No prior gynecologic surgeries History of genital warts for years ago status post excisional therapy Contraception: post menopausal status Last Pap: 2015. Pos hpv Results were: abnormal Last mammogram: 2015 . Results were: normal  Obstetric History OB History  Gravida Para Term Preterm AB Living  8 6 6   2 6   SAB TAB Ectopic Multiple Live Births  2       6    # Outcome Date GA Lbr Len/2nd Weight Sex Delivery Anes PTL Lv  8 SAB 1998          7 Term 1998   9  lb 1.6 oz (4.128 kg) M Vag-Spont   LIV  6 Term 1991   9 lb (4.082 kg) F Vag-Spont   LIV  5 Term 1986   9 lb 8 oz (4.309 kg) F Vag-Spont   LIV  4 SAB 1983          3 Term 1981   8 lb 1.6 oz (3.674 kg) F Vag-Spont   LIV  2 Term 1976   8 lb 3.2 oz (3.719 kg) F Vag-Spont   LIV  1 Term 52    M Vag-Spont   LIV      Past Medical History:  Diagnosis Date  . Arthritis   . Asthma    no problems in a year  . Complication of anesthesia 02/18/2012   ALOC  DIFFICULT WAKING UP  . Family history of anesthesia complication    mother had difficulty waking  . GERD (gastroesophageal reflux disease)   . Hepatic steatosis   . Hepatomegaly   . Hyperlipemia   . Hypertension    [facility] PCP- had pt. on HCTZ, but took herself off  2 yrs. ago  . Hypothyroidism   . Incontinence of urine   . Sleep apnea    has CPAP but does not use    Past Surgical History:  Procedure Laterality Date  . arthroscopic  knee     right knee  . CHOLECYSTECTOMY    . TOTAL KNEE ARTHROPLASTY  02/18/2012  . TOTAL KNEE ARTHROPLASTY  02/18/2012   Procedure: TOTAL KNEE ARTHROPLASTY;  Surgeon: Meredith Pel, MD;  Location: [facility];  Service: Orthopedics;  Laterality: Right;  Right total knee arthroplasty    Current Outpatient Medications on File Prior to Visit  Medication Sig Dispense Refill  . ezetimibe (ZETIA) 10 MG tablet     . lansoprazole (PREVACID) 30 MG capsule Take 40 mg by mouth daily at 12 noon.    Marland Kitchen levothyroxine (SYNTHROID, LEVOTHROID) 150 MCG tablet      No current facility-administered medications on file prior to visit.     Allergies  Allergen Reactions  . Tetanus Toxoids Anaphylaxis  . Hydrocodone-Acetaminophen Itching  . Morphine And Related     Social History   Socioeconomic History  . Marital status: Married    Spouse name: Not on file  . Number of children: Not on file  . Years of education: Not on file  . Highest education level: Not on file  Social Needs  . Financial resource strain:  Not on file  . Food insecurity - worry: Not on file  . Food insecurity - inability: Not on file  . Transportation needs - medical: Not on file  . Transportation needs - non-medical: Not on file  Occupational History  . Not on file  Tobacco Use  . Smoking status: Never Smoker  . Smokeless tobacco: Never Used  Substance and Sexual Activity  . Alcohol use: No  . Drug use: No  . Sexual activity: Not on file  Other Topics Concern  . Not on file  Social History Narrative  . Not on file    No family history on file.  The following portions of the patient's history were reviewed and updated as appropriate: allergies, current medications, past family history, past medical history, past social history, past surgical history and problem list.  Review of Systems Review of Systems  Constitutional: Positive for weight loss.       70 pound weight loss in the past 8 months through [place]: Negative.   Eyes: Negative.   Respiratory: Negative.   Cardiovascular: Negative.   Gastrointestinal: Positive for abdominal pain. Negative for blood in stool, constipation, diarrhea and melena.  Genitourinary: Negative.        Dyspareunia, deep thrusting  Musculoskeletal: Negative.   Skin: Negative.   Neurological: Negative.   Endo/Heme/Allergies: Negative.   Psychiatric/Behavioral: The patient is nervous/anxious.     Objective:   BP 136/83   Pulse (!) 102   Ht 5\' 7"  (1.702 m)   Wt 231 lb 8 oz (105 kg)   BMI 36.26 kg/m  CONSTITUTIONAL: Well-developed, well-nourished female in no acute distress.  PSYCHIATRIC: Normal mood and affect. Normal behavior. Normal judgment and thought content.  Tearful with discussing past sexual history NEUROLGIC: Alert and oriented to person, place, and time. Normal muscle tone coordination. No cranial nerve deficit noted. HENT:  Normocephalic, atraumatic, External right and left ear normal. Oropharynx is clear and moist EYES: Conjunctivae and EOM are  normal. No scleral icterus.  NECK: Normal range of motion, supple, no masses.  Normal thyroid.  SKIN: Skin is warm and dry. No rash noted. Not diaphoretic. No erythema. No pallor. CARDIOVASCULAR: Mild tachycardia, regular rhythm, no murmur. RESPIRATORY: Clear to auscultation bilaterally. Effort and breath sounds normal, no problems with respiration noted. BREASTS: Symmetric in size. No masses, skin changes, nipple drainage, or lymphadenopathy. ABDOMEN: Soft, normal bowel sounds, no distention noted.  No tenderness, rebound  or guarding.  BLADDER: Normal PELVIC:  External Genitalia: 5 mm condyloma noted at the right side of the posterior fourchette, otherwise normal  BUS: Normal  Vagina: Mild atrophy; grade 1 cystocele; no rectocele  Cervix: Normal; no discharge or lesions  Uterus: Normal; midplane, normal size and shape, mobile, nontender  Adnexa: Normal; no palpable masses or significant tenderness  RV: External Exam NormaI, No Rectal Masses and Normal Sphincter tone  MUSCULOSKELETAL: Normal range of motion. No tenderness.  No cyanosis, clubbing, or edema.  2+ distal pulses. LYMPHATIC: No Axillary, Supraclavicular, or Inguinal Adenopathy.    Assessment:   Annual gynecologic examination 62 y.o. Contraception: post menopausal status bmi- 36 Weight loss-70 pounds over 8 months on [place] Abdominal pelvic cramping of 8 months duration, nonacute Family history of ovarian cancer in mom and sister; no genetic testing Family history of colon cancer in maternal grandmother; no history of colonoscopy No recent mammogram or Pap smear Condyloma acuminata Plan:  Pap: Pap Co Test Mammogram: Ordered Stool Guaiac Testing:  order colonoscopy Labs: "thru pcp STD screening is performed today Genetic testing for breast cancer/ovarian cancer and colon cancer recommended; literature given Routine preventative health maintenance measures emphasized: Exercise/Diet/Weight control, Tobacco  Warnings, Alcohol/Substance use risks and Safe Sex Pelvic ultrasound Return in 4 weeks for follow-up on ultrasound results and lab work Consider prophylactic oophorectomy Return to Clinic - [place], CMA  Brayton Mars, MD  Note: This dictation was prepared with [employer] dictation along with smaller Company secretary. Any transcriptional errors that result from this process are unintentional.

## 2017-04-10 NOTE — Patient Instructions (Addendum)
1.  Pap smear is done 2.  Mammogram is ordered 3.  Screening colonoscopy is ordered 4.  STD screening is performed today 5.  Continue with healthy eating, exercise, and control weight loss 6.  Pelvic ultrasound is ordered to assess pelvic pain 7.  Literature is given for genetic testing regarding breast/ovarian cancer and colon cancer-recommended 8.  Return in 4 weeks for follow-up on ultrasound and lab work 9.  Return in 1 year for annual exam 10.  Patient is to consider prophylactic oophorectomy due to family history of ovarian cancer   Health Maintenance for Postmenopausal Women Menopause is a normal process in which your reproductive ability comes to an end. This process happens gradually over a span of months to years, usually between the ages of 5 and 69. Menopause is complete when you have missed 12 consecutive menstrual periods. It is important to talk with your health care provider about some of the most common conditions that affect postmenopausal women, such as heart disease, cancer, and bone loss (osteoporosis). Adopting a healthy lifestyle and getting preventive care can help to promote your health and wellness. Those actions can also lower your chances of developing some of these common conditions. What should I know about menopause? During menopause, you may experience a number of symptoms, such as:  Moderate-to-severe hot flashes.  Night sweats.  Decrease in sex drive.  Mood swings.  Headaches.  Tiredness.  Irritability.  Memory problems.  Insomnia.  Choosing to treat or not to treat menopausal changes is an individual decision that you make with your health care provider. What should I know about hormone replacement therapy and supplements? Hormone therapy products are effective for treating symptoms that are associated with menopause, such as hot flashes and night sweats. Hormone replacement carries certain risks, especially as you become older. If you are  thinking about using estrogen or estrogen with progestin treatments, discuss the benefits and risks with your health care provider. What should I know about heart disease and stroke? Heart disease, heart attack, and stroke become more likely as you age. This may be due, in part, to the hormonal changes that your body experiences during menopause. These can affect how your body processes dietary fats, triglycerides, and cholesterol. Heart attack and stroke are both medical emergencies. There are many things that you can do to help prevent heart disease and stroke:  Have your blood pressure checked at least every 1-2 years. High blood pressure causes heart disease and increases the risk of stroke.  If you are 54-75 years old, ask your health care provider if you should take aspirin to prevent a heart attack or a stroke.  Do not use any tobacco products, including cigarettes, chewing tobacco, or electronic cigarettes. If you need help quitting, ask your health care provider.  It is important to eat a healthy diet and maintain a healthy weight. ? Be sure to include plenty of vegetables, fruits, low-fat dairy products, and lean protein. ? Avoid eating foods that are high in solid fats, added sugars, or salt (sodium).  Get regular exercise. This is one of the most important things that you can do for your health. ? Try to exercise for at least 150 minutes each week. The type of exercise that you do should increase your heart rate and make you sweat. This is known as moderate-intensity exercise. ? Try to do strengthening exercises at least twice each week. Do these in addition to the moderate-intensity exercise.  Know your numbers.Ask your health  care provider to check your cholesterol and your blood glucose. Continue to have your blood tested as directed by your health care provider.  What should I know about cancer screening? There are several types of cancer. Take the following steps to reduce  your risk and to catch any cancer development as early as possible. Breast Cancer  Practice breast self-awareness. ? This means understanding how your breasts normally appear and feel. ? It also means doing regular breast self-exams. Let your health care provider know about any changes, no matter how small.  If you are 6 or older, have a clinician do a breast exam (clinical breast exam or CBE) every year. Depending on your age, family history, and medical history, it may be recommended that you also have a yearly breast X-ray (mammogram).  If you have a family history of breast cancer, talk with your health care provider about genetic screening.  If you are at high risk for breast cancer, talk with your health care provider about having an MRI and a mammogram every year.  Breast cancer (BRCA) gene test is recommended for women who have family members with BRCA-related cancers. Results of the assessment will determine the need for genetic counseling and BRCA1 and for BRCA2 testing. BRCA-related cancers include these types: ? Breast. This occurs in males or females. ? Ovarian. ? Tubal. This may also be called fallopian tube cancer. ? Cancer of the abdominal or pelvic lining (peritoneal cancer). ? Prostate. ? Pancreatic.  Cervical, Uterine, and Ovarian Cancer Your health care provider may recommend that you be screened regularly for cancer of the pelvic organs. These include your ovaries, uterus, and vagina. This screening involves a pelvic exam, which includes checking for microscopic changes to the surface of your cervix (Pap test).  For women ages 21-65, health care providers may recommend a pelvic exam and a Pap test every three years. For women ages 93-65, they may recommend the Pap test and pelvic exam, combined with testing for human papilloma virus (HPV), every five years. Some types of HPV increase your risk of cervical cancer. Testing for HPV may also be done on women of any age who  have unclear Pap test results.  Other health care providers may not recommend any screening for nonpregnant women who are considered low risk for pelvic cancer and have no symptoms. Ask your health care provider if a screening pelvic exam is right for you.  If you have had past treatment for cervical cancer or a condition that could lead to cancer, you need Pap tests and screening for cancer for at least 20 years after your treatment. If Pap tests have been discontinued for you, your risk factors (such as having a new sexual partner) need to be reassessed to determine if you should start having screenings again. Some women have medical problems that increase the chance of getting cervical cancer. In these cases, your health care provider may recommend that you have screening and Pap tests more often.  If you have a family history of uterine cancer or ovarian cancer, talk with your health care provider about genetic screening.  If you have vaginal bleeding after reaching menopause, tell your health care provider.  There are currently no reliable tests available to screen for ovarian cancer.  Lung Cancer Lung cancer screening is recommended for adults 105-43 years old who are at high risk for lung cancer because of a history of smoking. A yearly low-dose CT scan of the lungs is recommended if you:  Currently smoke.  Have a history of at least 30 pack-years of smoking and you currently smoke or have quit within the past 15 years. A pack-year is smoking an average of one pack of cigarettes per day for one year.  Yearly screening should:  Continue until it has been 15 years since you quit.  Stop if you develop a health problem that would prevent you from having lung cancer treatment.  Colorectal Cancer  This type of cancer can be detected and can often be prevented.  Routine colorectal cancer screening usually begins at age 33 and continues through age 55.  If you have risk factors for colon  cancer, your health care provider may recommend that you be screened at an earlier age.  If you have a family history of colorectal cancer, talk with your health care provider about genetic screening.  Your health care provider may also recommend using home test kits to check for hidden blood in your stool.  A small camera at the end of a tube can be used to examine your colon directly (sigmoidoscopy or colonoscopy). This is done to check for the earliest forms of colorectal cancer.  Direct examination of the colon should be repeated every 5-10 years until age 8. However, if early forms of precancerous polyps or small growths are found or if you have a family history or genetic risk for colorectal cancer, you may need to be screened more often.  Skin Cancer  Check your skin from head to toe regularly.  Monitor any moles. Be sure to tell your health care provider: ? About any new moles or changes in moles, especially if there is a change in a mole's shape or color. ? If you have a mole that is larger than the size of a pencil eraser.  If any of your family members has a history of skin cancer, especially at a young age, talk with your health care provider about genetic screening.  Always use sunscreen. Apply sunscreen liberally and repeatedly throughout the day.  Whenever you are outside, protect yourself by wearing long sleeves, pants, a wide-brimmed hat, and sunglasses.  What should I know about osteoporosis? Osteoporosis is a condition in which bone destruction happens more quickly than new bone creation. After menopause, you may be at an increased risk for osteoporosis. To help prevent osteoporosis or the bone fractures that can happen because of osteoporosis, the following is recommended:  If you are 70-74 years old, get at least 1,000 mg of calcium and at least 600 mg of vitamin D per day.  If you are older than age 48 but younger than age 23, get at least 1,200 mg of calcium and  at least 600 mg of vitamin D per day.  If you are older than age 10, get at least 1,200 mg of calcium and at least 800 mg of vitamin D per day.  Smoking and excessive alcohol intake increase the risk of osteoporosis. Eat foods that are rich in calcium and vitamin D, and do weight-bearing exercises several times each week as directed by your health care provider. What should I know about how menopause affects my mental health? Depression may occur at any age, but it is more common as you become older. Common symptoms of depression include:  Low or sad mood.  Changes in sleep patterns.  Changes in appetite or eating patterns.  Feeling an overall lack of motivation or enjoyment of activities that you previously enjoyed.  Frequent crying spells.  Talk with your health care provider if you think that you are experiencing depression. What should I know about immunizations? It is important that you get and maintain your immunizations. These include:  Tetanus, diphtheria, and pertussis (Tdap) booster vaccine.  Influenza every year before the flu season begins.  Pneumonia vaccine.  Shingles vaccine.  Your health care provider may also recommend other immunizations. This information is not intended to replace advice given to you by your health care provider. Make sure you discuss any questions you have with your health care provider. Document Released: 05/17/2005 Document Revised: 10/13/2015 Document Reviewed: 12/27/2014 Elsevier Interactive Patient Education  2018 [employer].

## 2017-04-11 LAB — HEPATITIS B SURFACE ANTIGEN: HEP B S AG: NEGATIVE

## 2017-04-11 LAB — HEPATITIS C ANTIBODY: Hep C Virus Ab: <0.1 s/co ratio (ref 0.0–0.9)

## 2017-04-11 LAB — HSV(HERPES SIMPLEX VRS) I + II AB-IGG: HSV 1 GLYCOPROTEIN G AB, IGG: 30.1 {index} — AB (ref 0.00–0.90)

## 2017-04-11 LAB — RPR: RPR: NONREACTIVE

## 2017-04-11 LAB — HIV ANTIBODY (ROUTINE TESTING W REFLEX): HIV Screen 4th Generation wRfx: NONREACTIVE

## 2017-04-12 LAB — CHLAMYDIA/GONOCOCCUS/TRICHOMONAS, NAA
CHLAMYDIA BY NAA: NEGATIVE
Gonococcus by NAA: NEGATIVE
Trich vag by NAA: NEGATIVE

## 2017-04-14 ENCOUNTER — Encounter: Payer: Self-pay | Admitting: Obstetrics and Gynecology

## 2017-04-14 LAB — IGP, COBASHPV16/18
HPV 16: NEGATIVE
HPV 18: NEGATIVE
HPV other hr types: NEGATIVE
PAP Smear Comment: 0

## 2017-04-16 ENCOUNTER — Telehealth: Payer: Self-pay | Admitting: *Deleted

## 2017-04-16 NOTE — Telephone Encounter (Signed)
Patient called and states she missed a call from the office. Patient is requesting a call back.. She thinks it ir regarding scheduling appts. She is unsure.Patient requesting a call back. Her contact information is [phone]. Please advise. Thank you

## 2017-04-18 ENCOUNTER — Telehealth: Payer: Self-pay | Admitting: Obstetrics and Gynecology

## 2017-04-18 NOTE — Telephone Encounter (Signed)
Pt aware all labs wnl expect hsv 1.

## 2017-04-18 NOTE — Telephone Encounter (Signed)
Pt aware of all labs results. Would like u/s results. Will send to mad.

## 2017-04-18 NOTE — Telephone Encounter (Signed)
The patient called and stated that she missed a call from [place], She would like to have a call back when possible. Please advise.

## 2017-04-21 NOTE — Telephone Encounter (Signed)
Pt aware per vm- u/s wnl.

## 2017-05-05 ENCOUNTER — Telehealth: Payer: Self-pay

## 2017-05-05 NOTE — Telephone Encounter (Signed)
Call to patient to see about scheduling a colonoscopy as she has been referred to our clinic. She reports that she has a history of GI symptoms including abdominal pain, feeling an extreme need to have a bowel movement after she eats. She reports the belly pain will come and go and that it is usually relieved with having a bowel movement. She reports that her movements are usually very mushy and runny, but at times she will also be constipated. I let her know that our providers would want to see her first in the office to assess her prior to her being scheduled for a colonoscopy. She refused this saying they are just going to go over the same questions and then set her up for a colonoscopy anyway. She states that she will just not have a colonoscopy then. I let her know to feel free to call us anytime to be seen.

## 2017-05-07 ENCOUNTER — Encounter: Payer: BC Managed Care – PPO | Admitting: Obstetrics and Gynecology

## 2017-05-12 ENCOUNTER — Telehealth: Payer: Self-pay | Admitting: Obstetrics and Gynecology

## 2017-05-12 NOTE — Telephone Encounter (Signed)
The patient called and stated that would like for Joyice Faster to call her back to give her specific information about her referral that was recently put in by Dr. Tennis Must. No other information was disclosed. Please advise.

## 2017-05-12 NOTE — Telephone Encounter (Signed)
Pt given number and name to [facility] to schedule colonoscopy.

## 2017-05-13 ENCOUNTER — Other Ambulatory Visit: Payer: Self-pay

## 2017-05-13 ENCOUNTER — Telehealth: Payer: Self-pay | Admitting: Gastroenterology

## 2017-05-13 DIAGNOSIS — Z1211 Encounter for screening for malignant neoplasm of colon: Secondary | ICD-10-CM

## 2017-05-13 NOTE — Telephone Encounter (Signed)
Gastroenterology Pre-Procedure Review  Request Date: 06/03/17 Requesting Physician: Dr. Vicente Males  PATIENT REVIEW QUESTIONS: The patient responded to the following health history questions as indicated:    1. Are you having any GI issues? no 2. Do you have a personal history of Polyps? no 3. Do you have a family history of Colon Cancer or Polyps? no 4. Diabetes Mellitus? no 5. Joint replacements in the past 12 months?no 6. Major health problems in the past 3 months?no 7. Any artificial heart valves, MVP, or defibrillator?no    MEDICATIONS & ALLERGIES:    Patient reports the following regarding taking any anticoagulation/antiplatelet therapy:   Plavix, Coumadin, Eliquis, Xarelto, Lovenox, Pradaxa, Brilinta, or Effient? no Aspirin? no  Patient confirms/reports the following medications:  Current Outpatient Medications  Medication Sig Dispense Refill  . ezetimibe (ZETIA) 10 MG tablet     . imiquimod (ALDARA) 5 % cream Apply topically 3 (three) times a week. Up to 16 weeks 12 each 0  . lansoprazole (PREVACID) 30 MG capsule Take 40 mg by mouth daily at 12 noon.    Marland Kitchen levothyroxine (SYNTHROID, LEVOTHROID) 150 MCG tablet      No current facility-administered medications for this visit.     Patient confirms/reports the following allergies:  Allergies  Allergen Reactions  . Morphine And Related Shortness Of Breath  . Tetanus Toxoids Anaphylaxis  . Hydrocodone-Acetaminophen Itching    No orders of the defined types were placed in this encounter.   AUTHORIZATION INFORMATION Primary Insurance: 1D#: Group #:  Secondary Insurance: 1D#: Group #:  SCHEDULE INFORMATION: Date: 06/03/17 Time: Location:[facility]

## 2017-05-13 NOTE — Telephone Encounter (Signed)
Patient called back to schedule a colonoscopy.

## 2017-06-02 ENCOUNTER — Other Ambulatory Visit: Payer: Self-pay

## 2017-06-02 MED ORDER — NA SULFATE-K SULFATE-MG SULF 17.5-3.13-1.6 GM/177ML PO SOLN: 1.0000 | Freq: Once | ORAL | 0 refills | Status: AC

## 2017-06-05 ENCOUNTER — Ambulatory Visit
Admission: RE | Admit: 2017-06-05 | Discharge: 2017-06-05 | Disposition: A | Discharge status: Home / Self Care | Payer: BC Managed Care – PPO | Source: Ambulatory Visit | Attending: Gastroenterology | Admitting: Gastroenterology

## 2017-06-05 ENCOUNTER — Encounter
Admission: RE | Disposition: A | Discharge status: Home / Self Care | Payer: Self-pay | Source: Ambulatory Visit | Attending: Gastroenterology

## 2017-06-05 ENCOUNTER — Ambulatory Visit: Payer: BC Managed Care – PPO | Admitting: Anesthesiology

## 2017-06-05 DIAGNOSIS — Z885 Allergy status to narcotic agent status: Secondary | ICD-10-CM | POA: Diagnosis not present

## 2017-06-05 DIAGNOSIS — M1990 Unspecified osteoarthritis, unspecified site: Secondary | ICD-10-CM | POA: Diagnosis not present

## 2017-06-05 DIAGNOSIS — G4730 Sleep apnea, unspecified: Secondary | ICD-10-CM | POA: Diagnosis not present

## 2017-06-05 DIAGNOSIS — Z9119 Patient's noncompliance with other medical treatment and regimen: Secondary | ICD-10-CM | POA: Insufficient documentation

## 2017-06-05 DIAGNOSIS — Z1211 Encounter for screening for malignant neoplasm of colon: Secondary | ICD-10-CM | POA: Diagnosis present

## 2017-06-05 DIAGNOSIS — K760 Fatty (change of) liver, not elsewhere classified: Secondary | ICD-10-CM | POA: Diagnosis not present

## 2017-06-05 DIAGNOSIS — D122 Benign neoplasm of ascending colon: Secondary | ICD-10-CM

## 2017-06-05 DIAGNOSIS — D125 Benign neoplasm of sigmoid colon: Secondary | ICD-10-CM | POA: Insufficient documentation

## 2017-06-05 DIAGNOSIS — D120 Benign neoplasm of cecum: Secondary | ICD-10-CM | POA: Insufficient documentation

## 2017-06-05 DIAGNOSIS — E785 Hyperlipidemia, unspecified: Secondary | ICD-10-CM | POA: Insufficient documentation

## 2017-06-05 DIAGNOSIS — I10 Essential (primary) hypertension: Secondary | ICD-10-CM | POA: Insufficient documentation

## 2017-06-05 DIAGNOSIS — K219 Gastro-esophageal reflux disease without esophagitis: Secondary | ICD-10-CM | POA: Diagnosis not present

## 2017-06-05 DIAGNOSIS — K635 Polyp of colon: Secondary | ICD-10-CM | POA: Diagnosis not present

## 2017-06-05 DIAGNOSIS — D123 Benign neoplasm of transverse colon: Secondary | ICD-10-CM | POA: Diagnosis not present

## 2017-06-05 DIAGNOSIS — Z79899 Other long term (current) drug therapy: Secondary | ICD-10-CM | POA: Insufficient documentation

## 2017-06-05 DIAGNOSIS — E039 Hypothyroidism, unspecified: Secondary | ICD-10-CM | POA: Insufficient documentation

## 2017-06-05 DIAGNOSIS — Z887 Allergy status to serum and vaccine status: Secondary | ICD-10-CM | POA: Diagnosis not present

## 2017-06-05 DIAGNOSIS — Z96651 Presence of right artificial knee joint: Secondary | ICD-10-CM | POA: Insufficient documentation

## 2017-06-05 DIAGNOSIS — J45909 Unspecified asthma, uncomplicated: Secondary | ICD-10-CM | POA: Insufficient documentation

## 2017-06-05 HISTORY — PX: COLONOSCOPY WITH PROPOFOL: SHX5780

## 2017-06-05 SURGERY — COLONOSCOPY WITH PROPOFOL
Anesthesia: General

## 2017-06-05 MED ORDER — LIDOCAINE HCL (CARDIAC) 20 MG/ML IV SOLN
INTRAVENOUS | Status: DC | PRN
  Administered 2017-06-05 (×2): 50 mg via INTRATRACHEAL

## 2017-06-05 MED ORDER — PROPOFOL 500 MG/50ML IV EMUL
INTRAVENOUS | Status: AC
  Filled 2017-06-05: qty 50

## 2017-06-05 MED ORDER — SODIUM CHLORIDE 0.9 % IV SOLN
INTRAVENOUS | Status: DC
  Administered 2017-06-05: 1000 mL via INTRAVENOUS

## 2017-06-05 MED ORDER — LIDOCAINE HCL (PF) 2 % IJ SOLN
INTRAMUSCULAR | Status: AC
  Filled 2017-06-05: qty 10

## 2017-06-05 MED ORDER — PROPOFOL 10 MG/ML IV BOLUS
INTRAVENOUS | Status: DC | PRN
  Administered 2017-06-05: 100 mg via INTRAVENOUS
  Administered 2017-06-05 (×6): 50 mg via INTRAVENOUS
  Administered 2017-06-05: 100 mg via INTRAVENOUS
  Administered 2017-06-05: 50 mg via INTRAVENOUS

## 2017-06-05 NOTE — Transfer of Care (Signed)
Immediate Anesthesia Transfer of Care Note  Patient: Mercedes Carter  Procedure(s) Performed: COLONOSCOPY WITH PROPOFOL (N/A )  Patient Location: PACU  Anesthesia Type:General  Level of Consciousness: awake, alert , oriented and patient cooperative  Airway & Oxygen Therapy: Patient Spontanous Breathing  Post-op Assessment: Report given to RN, Post -op Vital signs reviewed and stable and Patient moving all extremities X 4  Post vital signs: Reviewed and stable  Last Vitals:  Vitals:   06/05/17 0758 06/05/17 0913  BP: (!) 111/98 106/67  Pulse: (!) 103 78  Resp: 20   Temp: (!) 35.6 C (!) 36.1 C  SpO2: 100% 97%    Last Pain:  Vitals:   06/05/17 0913  TempSrc: Tympanic         Complications: No apparent anesthesia complications

## 2017-06-05 NOTE — Anesthesia Post-op Follow-up Note (Signed)
Anesthesia QCDR form completed.        

## 2017-06-05 NOTE — Progress Notes (Signed)
Clips x 4

## 2017-06-05 NOTE — H&P (Signed)
[name], MD [address] [place], [place], [place], [address] [address], [place], [place], [place], [address] Phone: [phone]  Fax: [phone]  Primary Care Physician:  Gayland Curry, MD   Pre-Procedure History & Physical: HPI:  Mercedes Carter is a 62 y.o. female is here for an colonoscopy.   Past Medical History:  Diagnosis Date  . Arthritis   . Asthma    no problems in a year  . Complication of anesthesia 02/18/2012   ALOC  DIFFICULT WAKING UP  . Family history of anesthesia complication    mother had difficulty waking  . GERD (gastroesophageal reflux disease)   . Hepatic steatosis   . Hepatomegaly   . Hyperlipemia   . Hypertension    [facility] PCP- had pt. on HCTZ, but took herself off  2 yrs. ago  . Hypothyroidism   . Incontinence of urine   . Sleep apnea    has CPAP but does not use    Past Surgical History:  Procedure Laterality Date  . arthroscopic  knee     right knee  . CHOLECYSTECTOMY    . TOTAL KNEE ARTHROPLASTY  02/18/2012  . TOTAL KNEE ARTHROPLASTY  02/18/2012   Procedure: TOTAL KNEE ARTHROPLASTY;  Surgeon: Meredith Pel, MD;  Location: [facility];  Service: Orthopedics;  Laterality: Right;  Right total knee arthroplasty    Prior to Admission medications   Medication Sig Start Date End Date Taking? Authorizing Provider  ezetimibe (ZETIA) 10 MG tablet  03/01/17  Yes [provider]  imiquimod (ALDARA) 5 % cream Apply topically 3 (three) times a week. Up to 16 weeks 04/11/17  Yes Defrancesco, Alanda Slim, MD  lansoprazole (PREVACID) 30 MG capsule Take 40 mg by mouth daily at 12 noon.   Yes [provider]  levothyroxine (SYNTHROID, LEVOTHROID) 150 MCG tablet  02/04/17  Yes [provider]    Allergies as of 05/13/2017 - Review Complete 04/10/2017  Allergen Reaction Noted  . Morphine and related Shortness Of Breath 04/10/2017  . Tetanus toxoids Anaphylaxis   . Hydrocodone-acetaminophen Itching 07/21/2014      Family History  Problem Relation Age of Onset  . Ovarian cancer Mother   . Diabetes Mother   . Ovarian cancer Sister   . Diabetes Maternal Aunt   . Colon cancer Maternal Grandmother   . Diabetes Maternal Grandmother   . Colon cancer Other   . Breast cancer Neg Hx     Social History   Socioeconomic History  . Marital status: Married    Spouse name: Not on file  . Number of children: Not on file  . Years of education: Not on file  . Highest education level: Not on file  Social Needs  . Financial resource strain: Not on file  . Food insecurity - worry: Not on file  . Food insecurity - inability: Not on file  . Transportation needs - medical: Not on file  . Transportation needs - non-medical: Not on file  Occupational History  . Not on file  Tobacco Use  . Smoking status: Never Smoker  . Smokeless tobacco: Never Used  Substance and Sexual Activity  . Alcohol use: No  . Drug use: No  . Sexual activity: Yes    Birth control/protection: Post-menopausal  Other Topics Concern  . Not on file  Social History Narrative  . Not on file    Review of Systems: See HPI, otherwise negative ROS  Physical Exam: BP Marland Kitchen)  111/98   Pulse (!) 103   Temp (!) 96 F (35.6 C) (Tympanic)   Resp 20   Ht 5\' 7"  (1.702 m)   Wt 235 lb (106.6 kg)   SpO2 100%   BMI 36.81 kg/m  General:   Alert,  pleasant and cooperative in NAD Head:  Normocephalic and atraumatic. Neck:  Supple; no masses or thyromegaly. Lungs:  Clear throughout to auscultation, normal respiratory effort.    Heart:  +S1, +S2, Regular rate and rhythm, No edema. Abdomen:  Soft, nontender and nondistended. Normal bowel sounds, without guarding, and without rebound.   Neurologic:  Alert and  oriented x4;  grossly normal neurologically.  Impression/Plan: Mercedes Carter is here for an colonoscopy to be performed for Screening colonoscopy average risk   Risks, benefits, limitations, and alternatives regarding  colonoscopy  have been reviewed with the patient.  Questions have been answered.  All parties agreeable.   [name], MD  06/05/2017, 8:03 AM

## 2017-06-05 NOTE — Anesthesia Preprocedure Evaluation (Signed)
Anesthesia Evaluation  Patient identified by MRN, date of birth, ID band Patient awake    Reviewed: Allergy & Precautions, H&P , NPO status , Patient's Chart, lab work & pertinent test results, reviewed documented beta blocker date and time   History of Anesthesia Complications (+) Family history of anesthesia reaction and history of anesthetic complications  Airway Mallampati: III  TM Distance: >3 FB Neck ROM: full    Dental  (+) Teeth Intact   Pulmonary neg pulmonary ROS, asthma , sleep apnea ,    Pulmonary exam normal        Cardiovascular Exercise Tolerance: Good hypertension, On Medications negative cardio ROS Normal cardiovascular exam Rhythm:regular Rate:Normal     Neuro/Psych negative neurological ROS  negative psych ROS   GI/Hepatic negative GI ROS, Neg liver ROS, GERD  Medicated,  Endo/Other  negative endocrine ROSHypothyroidism   Renal/GU negative Renal ROS  negative genitourinary   Musculoskeletal   Abdominal   Peds  Hematology negative hematology ROS (+)   Anesthesia Other Findings Past Medical History: No date: Arthritis No date: Asthma     Comment:  no problems in a year 46/80/3212: Complication of anesthesia     Comment:  ALOC  DIFFICULT WAKING UP No date: Family history of anesthesia complication     Comment:  mother had difficulty waking No date: GERD (gastroesophageal reflux disease) No date: Hepatic steatosis No date: Hepatomegaly No date: Hyperlipemia No date: Hypertension     Comment:  [facility] PCP- had pt. on HCTZ, but took herself off  2 yrs.               ago No date: Hypothyroidism No date: Incontinence of urine No date: Sleep apnea     Comment:  has CPAP but does not use Past Surgical History: No date: arthroscopic  knee     Comment:  right knee No date: CHOLECYSTECTOMY 02/18/2012: TOTAL KNEE ARTHROPLASTY 02/18/2012: TOTAL KNEE ARTHROPLASTY     Comment:  Procedure: TOTAL  KNEE ARTHROPLASTY;  Surgeon: Meredith Pel, MD;  Location: [facility];  Service: Orthopedics;               Laterality: Right;  Right total knee arthroplasty BMI    Body Mass Index:  36.81 kg/m     Reproductive/Obstetrics negative OB ROS                             Anesthesia Physical Anesthesia Plan  ASA: III  Anesthesia Plan: General   Post-op Pain Management:    Induction:   PONV Risk Score and Plan:   Airway Management Planned:   Additional Equipment:   Intra-op Plan:   Post-operative Plan:   Informed Consent: I have reviewed the patients History and Physical, chart, labs and discussed the procedure including the risks, benefits and alternatives for the proposed anesthesia with the patient or authorized representative who has indicated his/her understanding and acceptance.   Dental Advisory Given  Plan Discussed with: CRNA  Anesthesia Plan Comments:         Anesthesia Quick Evaluation

## 2017-06-05 NOTE — Anesthesia Postprocedure Evaluation (Signed)
Anesthesia Post Note  Patient: TAUSHA MILHOAN  Procedure(s) Performed: COLONOSCOPY WITH PROPOFOL (N/A )  Patient location during evaluation: Endoscopy Anesthesia Type: General Level of consciousness: awake and alert, patient cooperative and oriented Pain management: satisfactory to patient Vital Signs Assessment: post-procedure vital signs reviewed and stable Respiratory status: spontaneous breathing and respiratory function stable Cardiovascular status: blood pressure returned to baseline and stable Postop Assessment: no headache, no backache, patient able to bend at knees, no apparent nausea or vomiting and adequate PO intake Anesthetic complications: no     Last Vitals:  Vitals:   06/05/17 0758 06/05/17 0913  BP: (!) 111/98 106/67  Pulse: (!) 103 78  Resp: 20   Temp: (!) 35.6 C (!) 36.1 C  SpO2: 100% 97%    Last Pain:  Vitals:   06/05/17 0913  TempSrc: Tympanic                 Chauntae Hults H Montey Ebel

## 2017-06-05 NOTE — Op Note (Signed)
[facility] Gastroenterology Patient Name: Mercedes Carter Procedure Date: 06/05/2017 8:33 AM MRN: [id number] Account #: [id number] Date of Birth: 31-Mar-1956 Admit Type: Outpatient Age: 62 Room: [facility] ENDO ROOM 3 Gender: Female Note Status: Finalized Procedure:            Colonoscopy Indications:          Screening for colorectal malignant neoplasm Providers:            [name] MD, MD Referring MD:         Gayland Curry MD, MD (Referring MD) Medicines:            Monitored Anesthesia Care Complications:        No immediate complications. Procedure:            Pre-Anesthesia Assessment:                       - Prior to the procedure, a History and Physical was                        performed, and patient medications, allergies and                        sensitivities were reviewed. The patient's tolerance of                        previous anesthesia was reviewed.                       - The risks and benefits of the procedure and the                        sedation options and risks were discussed with the                        patient. All questions were answered and informed                        consent was obtained.                       - ASA Grade Assessment: III - A patient with severe                        systemic disease.                       After obtaining informed consent, the colonoscope was                        passed under direct vision. Throughout the procedure,                        the patient's blood pressure, pulse, and oxygen                        saturations were monitored continuously. The                        Colonoscope was introduced through the anus and  advanced to the the cecum, identified by the                        appendiceal orifice, IC valve and transillumination.                        The colonoscopy was performed with ease. The patient                        tolerated the procedure well.  The quality of the bowel                        preparation was good. Findings:      The perianal and digital rectal examinations were normal.      A 10 mm polyp was found in the cecum. The polyp was sessile.       Preparations were made for mucosal resection. Saline was injected to       raise the lesion. Snare mucosal resection was performed. Resection and       retrieval were complete.      A 20 mm polyp was found in the proximal ascending colon. The polyp was       sessile. Preparations were made for mucosal resection. 3 mL of eleview       was injected with adequate lift of the lesion from the muscularis       propria. Snare mucosal resection with suction (via the working channel)       retrieval was performed. A 23 mm area was resected. Resection was       complete, and retrieval was complete. There was no bleeding at the end       of the maneuver. To prevent bleeding after the polypectomy, three       hemostatic clips were successfully placed. There was no bleeding during,       or at the end, of the procedure. Polyp taken ouit piece meal      A 7 mm polyp was found in the transverse colon. The polyp was sessile.       The polyp was removed with a cold snare. Resection and retrieval were       complete.      A 20 mm polyp was found in the sigmoid colon. The polyp was       pedunculated. The polyp was removed with a hot snare. Resection and       retrieval were complete. To prevent bleeding after the polypectomy, one       hemostatic clip was successfully placed. There was no bleeding during,       or at the end, of the procedure.      The exam was otherwise without abnormality on direct and retroflexion       views. Impression:           - One 10 mm polyp in the cecum, removed with mucosal                        resection. Resected and retrieved.                       - One 20 mm polyp in the proximal ascending colon,  removed with mucosal resection.  Resected and retrieved.                        Clips were placed.                       - One 7 mm polyp in the transverse colon, removed with                        a cold snare. Resected and retrieved.                       - One 20 mm polyp in the sigmoid colon, removed with a                        hot snare. Resected and retrieved. Clip was placed.                       - The examination was otherwise normal on direct and                        retroflexion views.                       - Mucosal resection was performed. Resection and                        retrieval were complete.                       - Mucosal resection was performed. Resection was                        complete, and retrieval was complete. Recommendation:       - Discharge patient to home (with escort).                       - Resume previous diet.                       - Continue present medications.                       - Await pathology results.                       - Repeat colonoscopy in 6 months for surveillance and                        for surveillance after piecemeal polypectomy. Procedure Code(s):    --- Professional ---                       [phone], 34, Colonoscopy, flexible; with endoscopic                        mucosal resection                       [phone], Colonoscopy, flexible; with removal of tumor(s),                        polyp(s), or other lesion(s) by snare technique  Diagnosis Code(s):    --- Professional ---                       Z12.11, Encounter for screening for malignant neoplasm                        of colon                       D12.0, Benign neoplasm of cecum                       D12.2, Benign neoplasm of ascending colon                       D12.3, Benign neoplasm of transverse colon (hepatic                        flexure or splenic flexure)                       D12.5, Benign neoplasm of sigmoid colon CPT copyright 2016 American Medical Association. All rights  reserved. The codes documented in this report are preliminary and upon coder review may  be revised to meet current compliance requirements. [name], MD [name] MD, MD 06/05/2017 9:14:43 AM This report has been signed electronically. Number of Addenda: 0 Note Initiated On: 06/05/2017 8:33 AM Scope Withdrawal Time: 0 hours 29 minutes 42 seconds  Total Procedure Duration: 0 hours 31 minutes 22 seconds       [facility]

## 2017-06-06 ENCOUNTER — Encounter: Payer: Self-pay | Admitting: Gastroenterology

## 2017-06-06 LAB — SURGICAL PATHOLOGY

## 2017-06-08 ENCOUNTER — Encounter: Payer: Self-pay | Admitting: Gastroenterology

## 2017-06-24 ENCOUNTER — Telehealth: Payer: Self-pay | Admitting: Gastroenterology

## 2017-06-24 NOTE — Telephone Encounter (Signed)
Pt left vm for colonoscopy results please call pt [phone] or [phone]

## 2017-06-26 NOTE — Telephone Encounter (Signed)
Advised patient of results per result review letter.   Patient states IBS symptoms. Advised to schedule an office visit for possible treatment.

## 2017-07-16 ENCOUNTER — Ambulatory Visit: Payer: BC Managed Care – PPO | Admitting: Gastroenterology

## 2017-07-16 ENCOUNTER — Other Ambulatory Visit
Admission: RE | Admit: 2017-07-16 | Discharge: 2017-07-16 | Disposition: A | Discharge status: Home / Self Care | Payer: BC Managed Care – PPO | Source: Ambulatory Visit | Attending: Gastroenterology | Admitting: Gastroenterology

## 2017-07-16 ENCOUNTER — Encounter: Payer: Self-pay | Admitting: Gastroenterology

## 2017-07-16 DIAGNOSIS — K9089 Other intestinal malabsorption: Secondary | ICD-10-CM | POA: Diagnosis not present

## 2017-07-16 MED ORDER — DICYCLOMINE HCL 10 MG PO CAPS
10.0000 mg | ORAL_CAPSULE | Freq: Three times a day (TID) | ORAL | 0 refills | Status: DC
Start: 2017-07-16 — End: 2018-04-21

## 2017-07-16 MED ORDER — CHOLESTYRAMINE 4 G PO PACK: 4.0000 g | PACK | Freq: Three times a day (TID) | ORAL | 12 refills | Status: DC

## 2017-07-16 NOTE — Progress Notes (Signed)
[name] MD, MRCP(U.K) [address]  [place]  [place], [place] [address]  Main: [phone]  Fax: [phone]   Gastroenterology Consultation  Referring Provider:     Gayland Curry, MD Primary Care Physician:  Gayland Curry, MD Primary Gastroenterologist:  Dr. [name]  Reason for Consultation:     IBS         HPI:   Mercedes Carter is a 62 y.o. y/o female referred for consultation & management  by Dr. Gayland Curry, MD.    She was seen by me on 06/05/17 when she underwent a colonoscopy for colon cancer screening , multiple polyps were resected with mucosal resection and pathology included adenomas and villous adenomas. Plan was to repeat colonoscopy in 11/2017 due to piece meal resection .   Today she says she is here to see me for IBS.   Abdominal pain: Onset: Began a week after colonoscopy - pain occurs 3-4 times a week , 10 each episode  Site :LLQ and RUQ  Radiation: localized  Severity :initially 10/10 and then gets better  Petra Kuba of pain: like a knife then like a burn  Aggravating factors: not sure  Relieving factors :bowel movements  Weight loss: gained weight  NSAID use: uses tylenol  PPI use :takes omeprazole daily  Gall bladder surgery: taken out - does not feel like gall bladder issue  Frequency of bowel movements: 3-4 times -since gall bladder taken out 5 years back -mushy , watery  Change in bowel movements: no  Relief with bowel movements: yes  Gas/Bloating/Abdominal distension: gas , not all the time   No artificial sugars, no sodas. Drinks diet tea. Mostly watery.   Says the muscles and bones around rectum get sore when she takes a bowel movement .  Internal pain     Past Medical History:  Diagnosis Date  . Arthritis   . Asthma    no problems in a year  . Complication of anesthesia 02/18/2012   ALOC  DIFFICULT WAKING UP  . Family history of anesthesia complication    mother had difficulty waking  . GERD  (gastroesophageal reflux disease)   . Hepatic steatosis   . Hepatomegaly   . Hyperlipemia   . Hypertension    [facility] PCP- had pt. on HCTZ, but took herself off  2 yrs. ago  . Hypothyroidism   . Incontinence of urine   . Sleep apnea    has CPAP but does not use    Past Surgical History:  Procedure Laterality Date  . arthroscopic  knee     right knee  . CHOLECYSTECTOMY    . COLONOSCOPY WITH PROPOFOL N/A 06/05/2017   Procedure: COLONOSCOPY WITH PROPOFOL;  Surgeon: [name], MD;  Location: [facility] ENDOSCOPY;  Service: Gastroenterology;  Laterality: N/A;  . TOTAL KNEE ARTHROPLASTY  02/18/2012  . TOTAL KNEE ARTHROPLASTY  02/18/2012   Procedure: TOTAL KNEE ARTHROPLASTY;  Surgeon: Meredith Pel, MD;  Location: [facility];  Service: Orthopedics;  Laterality: Right;  Right total knee arthroplasty    Prior to Admission medications   Medication Sig Start Date End Date Taking? Authorizing Provider  esomeprazole (NEXIUM) 40 MG capsule  06/20/17  Yes [provider]  ezetimibe (ZETIA) 10 MG tablet  03/01/17  Yes [provider]  hydrochlorothiazide (HYDRODIURIL) 25 MG tablet  05/13/17  Yes [provider]  imiquimod (ALDARA) 5 % cream Apply topically 3 (three) times a week. Up to 16 weeks 04/11/17  Yes Defrancesco, Alanda Slim,  MD  levothyroxine (SYNTHROID, LEVOTHROID) 150 MCG tablet  02/04/17  Yes [provider]    Family History  Problem Relation Age of Onset  . Ovarian cancer Mother   . Diabetes Mother   . Ovarian cancer Sister   . Diabetes Maternal Aunt   . Colon cancer Maternal Grandmother   . Diabetes Maternal Grandmother   . Colon cancer Other   . Breast cancer Neg Hx      Social History   Tobacco Use  . Smoking status: Never Smoker  . Smokeless tobacco: Never Used  Substance Use Topics  . Alcohol use: Yes    Comment: occ  . Drug use: No    Allergies as of 07/16/2017 - Review Complete 07/16/2017  Allergen Reaction Noted  . Morphine and  related Shortness Of Breath 04/10/2017  . Tetanus toxoid Anaphylaxis 06/26/2017  . Tetanus toxoids Anaphylaxis   . Hydrocodone-acetaminophen Itching 07/21/2014    Review of Systems:    All systems reviewed and negative except where noted in HPI.   Physical Exam:  There were no vitals taken for this visit. No LMP recorded. Patient is postmenopausal. Psych:  Alert and cooperative. Normal mood and affect. General:   Alert,  Well-developed, well-nourished, pleasant and cooperative in NAD Head:  Normocephalic and atraumatic. Eyes:  Sclera clear, no icterus.   Conjunctiva pink. Ears:  Normal auditory acuity. Nose:  No deformity, discharge, or lesions. Mouth:  No deformity or lesions,oropharynx pink & moist. Neck:  Supple; no masses or thyromegaly. Lungs:  Respirations even and unlabored.  Clear throughout to auscultation.   No wheezes, crackles, or rhonchi. No acute distress. Heart:  Regular rate and rhythm; no murmurs, clicks, rubs, or gallops. Abdomen:  Normal bowel sounds.  No bruits.  Soft, non-tender and non-distended without masses, hepatosplenomegaly or hernias noted.  No guarding or rebound tenderness.    Msk:  Symmetrical without gross deformities. Good, equal movement & strength bilaterally. Pulses:  Normal pulses noted. Extremities:  No clubbing or edema.  No cyanosis. Neurologic:  Alert and oriented x3;  grossly normal neurologically. Skin:  Intact without significant lesions or rashes. No jaundice. Lymph Nodes:  No significant cervical adenopathy. Psych:  Alert and cooperative. Normal mood and affect.  Imaging Studies: No results found.  Assessment and Plan:   Mercedes Carter is a 62 y.o. y/o female has been referred for abdominal pain , likely combination of IBS/bikle salt diarrhea .    Plan  1. Colonoscopy in 11/2017 for polyp surveillance due to piece meal resection  2. Questran for bile salt diarrhea 3. Celiac serology  4. Bentyl PRN    Follow up in 6-8 weeks     Dr [name] MD,MRCP(U.K)

## 2017-07-18 ENCOUNTER — Encounter: Payer: Self-pay | Admitting: Gastroenterology

## 2017-07-18 LAB — CELIAC DISEASE PANEL
ENDOMYSIAL ANTIBODY IGA: NEGATIVE
IGA: 301 mg/dL (ref 87–352)
Tissue Transglutaminase Ab, IgA: <2 U/mL (ref 0–3)

## 2017-08-18 ENCOUNTER — Ambulatory Visit: Payer: BC Managed Care – PPO | Admitting: Gastroenterology

## 2017-09-29 ENCOUNTER — Ambulatory Visit: Payer: BC Managed Care – PPO | Admitting: Gastroenterology

## 2017-09-29 ENCOUNTER — Encounter: Payer: Self-pay | Admitting: Gastroenterology

## 2017-09-29 VITALS — BP 133/81 | HR 102 | Ht 67.0 in | Wt 250.0 lb

## 2017-09-29 DIAGNOSIS — R1084 Generalized abdominal pain: Secondary | ICD-10-CM

## 2017-09-29 DIAGNOSIS — Z860100 Personal history of colon polyps, unspecified: Secondary | ICD-10-CM

## 2017-09-29 DIAGNOSIS — K9089 Other intestinal malabsorption: Secondary | ICD-10-CM

## 2017-09-29 NOTE — Progress Notes (Signed)
[name] MD, MRCP(U.K) [address]  [place]  [place], [place] [address]  Main: [phone]  Fax: [phone]   Primary Care Physician: Gayland Curry, MD  Primary Gastroenterologist:  Dr. [name]   No chief complaint on file.   HPI: Mercedes Carter is a 62 y.o. female   Summary of history :  She is here today to see me for a follow up for diarrhea . Initially seen on 07/16/17 .She was seen by me on 06/05/17 when she underwent a colonoscopy for colon cancer screening , multiple polyps were resected with mucosal resection and pathology included adenomas and villous adenomas. Plan was to repeat colonoscopy in 11/2017 due to piece meal resection .   Has had abdominal pain since her colonoscopy , 3-4 times a week , LLQ and RUQ, 10/10 , better after a bowel movement , on PPI, gained weight , 3-4 bowel movents a day since cholecystectomy. Drinks diet tea.   Interval history   4//2019-  09/29/2017   07/2017 : celiac serology negative  Doing well , diarrhea much better. Abdominal pain also much better.   Current Outpatient Medications  Medication Sig Dispense Refill  . cholestyramine (QUESTRAN) 4 g packet Take 1 packet (4 g total) by mouth 3 (three) times daily with meals. 60 each 12  . dicyclomine (BENTYL) 10 MG capsule Take 1 capsule (10 mg total) by mouth 4 (four) times daily -  before meals and at bedtime. 90 capsule 0  . esomeprazole (NEXIUM) 40 MG capsule     . ezetimibe (ZETIA) 10 MG tablet     . hydrochlorothiazide (HYDRODIURIL) 25 MG tablet     . imiquimod (ALDARA) 5 % cream Apply topically 3 (three) times a week. Up to 16 weeks 12 each 0  . levothyroxine (SYNTHROID, LEVOTHROID) 150 MCG tablet      No current facility-administered medications for this visit.     Allergies as of 09/29/2017 - Review Complete 07/16/2017  Allergen Reaction Noted  . Morphine and related Shortness Of Breath 04/10/2017  . Tetanus toxoid Anaphylaxis 06/26/2017  . Tetanus  toxoids Anaphylaxis   . Hydrocodone-acetaminophen Itching 07/21/2014    ROS:  General: Negative for anorexia, weight loss, fever, chills, fatigue, weakness. ENT: Negative for hoarseness, difficulty swallowing , nasal congestion. CV: Negative for chest pain, angina, palpitations, dyspnea on exertion, peripheral edema.  Respiratory: Negative for dyspnea at rest, dyspnea on exertion, cough, sputum, wheezing.  GI: See history of present illness. GU:  Negative for dysuria, hematuria, urinary incontinence, urinary frequency, nocturnal urination.  Endo: Negative for unusual weight change.    Physical Examination:   BP 133/81   Pulse (!) 102   Ht 5\' 7"  (1.702 m)   Wt 250 lb (113.4 kg)   BMI 39.16 kg/m   General: Well-nourished, well-developed in no acute distress.  Eyes: No icterus. Conjunctivae pink. Mouth: Oropharyngeal mucosa moist and pink , no lesions erythema or exudate. Lungs: Clear to auscultation bilaterally. Non-labored. Heart: Regular rate and rhythm, no murmurs rubs or gallops.  Abdomen: Bowel sounds are normal, nontender, nondistended, no hepatosplenomegaly or masses, no abdominal bruits or hernia , no rebound or guarding.   Extremities: No lower extremity edema. No clubbing or deformities. Neuro: Alert and oriented x 3.  Grossly intact. Skin: Warm and dry, no jaundice.   Psych: Alert and cooperative, normal mood and affect.   Imaging Studies: No results found.  Assessment and Plan:   Mercedes Carter is a 62 y.o. y/o  female here to follow up  for abdominal pain , likely combination of IBS/bile salt diarrhea . Both doing much better.    Plan  1. Colonoscopy in 11/2017 for polyp surveillance due to piece meal resection  2. Trial of IB guard- 2 weeks samples provided.   I have discussed alternative options, risks & benefits,  which include, but are not limited to, bleeding, infection, perforation,respiratory complication & drug reaction.  The patient agrees with  this plan & written consent will be obtained.      Dr [name]  MD,MRCP [facility]) Follow up PRN

## 2017-09-30 NOTE — Addendum Note (Signed)
Addended by: Peggye Ley on: 09/30/2017 11:16 AM   Modules accepted: Orders, SmartSet

## 2017-11-20 ENCOUNTER — Telehealth: Payer: Self-pay | Admitting: Gastroenterology

## 2017-11-20 NOTE — Telephone Encounter (Signed)
Pt left vm  To r/s her procedure 11/24/17 with Dr. Vicente Males she is out of town also she has not received any instructions p[lease call [phone]

## 2017-11-21 NOTE — Telephone Encounter (Signed)
LVM for pt to call office to reschedule her colonosocpy.  I've canceled her colonoscopy with Dr. Vicente Males on 08/19.  Pt stated she did not receive instructions, however instructions were mailed to her on 09/30/17.  Thank you,  Sharyn Lull

## 2017-11-24 ENCOUNTER — Encounter: Admission: RE | Payer: Self-pay | Source: Ambulatory Visit

## 2017-11-24 ENCOUNTER — Ambulatory Visit
Admission: RE | Admit: 2017-11-24 | Payer: BC Managed Care – PPO | Source: Ambulatory Visit | Admitting: Gastroenterology

## 2017-11-24 SURGERY — COLONOSCOPY WITH PROPOFOL
Anesthesia: General

## 2017-12-22 ENCOUNTER — Telehealth: Payer: Self-pay | Admitting: Gastroenterology

## 2017-12-22 NOTE — Telephone Encounter (Signed)
Pt left vm to schedule colonoscopy  °

## 2017-12-24 NOTE — Telephone Encounter (Signed)
Pt left vm she was told someone would call her yesterday to schedule an apt please call pt today

## 2017-12-30 ENCOUNTER — Telehealth: Payer: Self-pay

## 2017-12-30 NOTE — Telephone Encounter (Signed)
Returned patients phone call to schedule her colonoscopy with Dr. Vicente Males.  LVM for her to call back.  Thanks [employer]

## 2018-02-06 ENCOUNTER — Other Ambulatory Visit: Payer: Self-pay

## 2018-02-06 DIAGNOSIS — Z86010 Personal history of colonic polyps: Secondary | ICD-10-CM

## 2018-02-27 ENCOUNTER — Telehealth: Payer: Self-pay

## 2018-02-27 NOTE — Telephone Encounter (Signed)
Pt husband, Ronalee Belts, called to request that we reschedule pt colonoscopy procedure to January 2020 due to pt possibly having pneumonia and other financial reasons. Pt husband also requested updated procedure instructions. I explained that I will send pt instructions via mail. I have contacted [facility] Endo to inform them of procedure date change to 04-21-2018.

## 2018-03-20 ENCOUNTER — Telehealth: Payer: Self-pay | Admitting: Obstetrics and Gynecology

## 2018-03-20 MED ORDER — IMIQUIMOD 5 % EX CREA: TOPICAL_CREAM | CUTANEOUS | 0 refills | Status: DC

## 2018-03-20 NOTE — Telephone Encounter (Signed)
Please advise on refill since Dr. Keturah Barre is not in office.

## 2018-03-20 NOTE — Telephone Encounter (Signed)
The patient states she did not take her medication as directed and now her issue is worse, and she is asking if she can get a refill of her Aldara 5%, please advise, see note below too for Dr. Tennis Must.  NOTE TO DR DE:  Tell him - (she is crying) if it wasn't for him I would be dead and they tested me for everything and they said I can't find anything wrong with you, take this medication, sent me to a gastro specialist, and she was told "if you had waited 3 months to come to me I would not be able to help you!"  She had Stage IV cancer, and if he had not taken that time with me I would be DEAD.  He saved my life, and I want you to tell him that for me.  HE SAVED MY LIFE!  That was very important to me.  He said we are going to find what is wrong with you.  She is going for next colonoscopy in January for a check up.

## 2018-04-21 ENCOUNTER — Encounter
Admission: RE | Disposition: A | Discharge status: Home / Self Care | Payer: Self-pay | Source: Ambulatory Visit | Attending: Gastroenterology

## 2018-04-21 ENCOUNTER — Ambulatory Visit: Payer: BC Managed Care – PPO | Admitting: Anesthesiology

## 2018-04-21 ENCOUNTER — Encounter: Payer: Self-pay | Admitting: *Deleted

## 2018-04-21 ENCOUNTER — Ambulatory Visit
Admission: RE | Admit: 2018-04-21 | Discharge: 2018-04-21 | Disposition: A | Discharge status: Home / Self Care | Payer: BC Managed Care – PPO | Source: Ambulatory Visit | Attending: Gastroenterology | Admitting: Gastroenterology

## 2018-04-21 DIAGNOSIS — Z79890 Hormone replacement therapy (postmenopausal): Secondary | ICD-10-CM | POA: Insufficient documentation

## 2018-04-21 DIAGNOSIS — D122 Benign neoplasm of ascending colon: Secondary | ICD-10-CM | POA: Diagnosis not present

## 2018-04-21 DIAGNOSIS — Z86010 Personal history of colonic polyps: Secondary | ICD-10-CM | POA: Diagnosis not present

## 2018-04-21 DIAGNOSIS — Z79899 Other long term (current) drug therapy: Secondary | ICD-10-CM | POA: Diagnosis not present

## 2018-04-21 DIAGNOSIS — E039 Hypothyroidism, unspecified: Secondary | ICD-10-CM | POA: Diagnosis not present

## 2018-04-21 DIAGNOSIS — K219 Gastro-esophageal reflux disease without esophagitis: Secondary | ICD-10-CM | POA: Insufficient documentation

## 2018-04-21 DIAGNOSIS — K635 Polyp of colon: Secondary | ICD-10-CM | POA: Insufficient documentation

## 2018-04-21 DIAGNOSIS — I10 Essential (primary) hypertension: Secondary | ICD-10-CM | POA: Insufficient documentation

## 2018-04-21 DIAGNOSIS — Z96651 Presence of right artificial knee joint: Secondary | ICD-10-CM | POA: Diagnosis not present

## 2018-04-21 DIAGNOSIS — G4730 Sleep apnea, unspecified: Secondary | ICD-10-CM | POA: Diagnosis not present

## 2018-04-21 DIAGNOSIS — Z09 Encounter for follow-up examination after completed treatment for conditions other than malignant neoplasm: Secondary | ICD-10-CM | POA: Diagnosis present

## 2018-04-21 HISTORY — PX: COLONOSCOPY WITH PROPOFOL: SHX5780

## 2018-04-21 SURGERY — COLONOSCOPY WITH PROPOFOL
Anesthesia: General

## 2018-04-21 MED ORDER — LIDOCAINE HCL (CARDIAC) PF 100 MG/5ML IV SOSY
PREFILLED_SYRINGE | INTRAVENOUS | Status: DC | PRN
  Administered 2018-04-21: 50 mg via INTRATRACHEAL

## 2018-04-21 MED ORDER — PROPOFOL 10 MG/ML IV BOLUS
INTRAVENOUS | Status: DC | PRN
  Administered 2018-04-21: 80 mg via INTRAVENOUS
  Administered 2018-04-21: 20 mg via INTRAVENOUS

## 2018-04-21 MED ORDER — PROPOFOL 500 MG/50ML IV EMUL
INTRAVENOUS | Status: DC | PRN
  Administered 2018-04-21: 140 ug/kg/min via INTRAVENOUS

## 2018-04-21 MED ORDER — SODIUM CHLORIDE 0.9 % IV SOLN
INTRAVENOUS | Status: DC
  Administered 2018-04-21: 1000 mL via INTRAVENOUS

## 2018-04-21 NOTE — H&P (Signed)
[name], MD [address] [place], [place], [place], [address] [address], [place], [place], [place], [address] Phone: [phone]  Fax: [phone]  Primary Care Physician:  [facility]   Pre-Procedure History & Physical: HPI:  Mercedes Carter is a 63 y.o. female is here for an colonoscopy.   Past Medical History:  Diagnosis Date  . Arthritis   . Asthma    no problems in a year  . Complication of anesthesia 02/18/2012   ALOC  DIFFICULT WAKING UP  . Family history of anesthesia complication    mother had difficulty waking  . GERD (gastroesophageal reflux disease)   . Hepatic steatosis   . Hepatomegaly   . Hyperlipemia   . Hypertension    [facility] PCP- had pt. on HCTZ, but took herself off  2 yrs. ago  . Hypothyroidism   . Incontinence of urine   . Sleep apnea    has CPAP but does not use    Past Surgical History:  Procedure Laterality Date  . arthroscopic  knee     right knee  . CHOLECYSTECTOMY    . COLONOSCOPY WITH PROPOFOL N/A 06/05/2017   Procedure: COLONOSCOPY WITH PROPOFOL;  Surgeon: [name], MD;  Location: [facility] ENDOSCOPY;  Service: Gastroenterology;  Laterality: N/A;  . TOTAL KNEE ARTHROPLASTY  02/18/2012  . TOTAL KNEE ARTHROPLASTY  02/18/2012   Procedure: TOTAL KNEE ARTHROPLASTY;  Surgeon: Meredith Pel, MD;  Location: [facility];  Service: Orthopedics;  Laterality: Right;  Right total knee arthroplasty    Prior to Admission medications   Medication Sig Start Date End Date Taking? Authorizing Provider  acetaminophen (TYLENOL) 325 MG tablet Take 325 mg by mouth 3 (three) times daily.    [provider]  hydrochlorothiazide (HYDRODIURIL) 25 MG tablet  05/13/17   [provider]  imiquimod (ALDARA) 5 % cream Apply topically 3 (three) times a week. Up to 16 weeks 03/20/18   Rubie Maid, MD  levothyroxine Wilmer Floor, LEVOTHROID) 150 MCG tablet  02/04/17   [provider]    Allergies as of 02/06/2018 -  Review Complete 09/29/2017  Allergen Reaction Noted  . Morphine and related Shortness Of Breath 04/10/2017  . Tetanus toxoid Anaphylaxis 06/26/2017  . Tetanus toxoids Anaphylaxis   . Hydrocodone-acetaminophen Itching 07/21/2014    Family History  Problem Relation Age of Onset  . Ovarian cancer Mother   . Diabetes Mother   . Ovarian cancer Sister   . Diabetes Maternal Aunt   . Colon cancer Maternal Grandmother   . Diabetes Maternal Grandmother   . Colon cancer Other   . Breast cancer Neg Hx     Social History   Socioeconomic History  . Marital status: Married    Spouse name: Not on file  . Number of children: Not on file  . Years of education: Not on file  . Highest education level: Not on file  Occupational History  . Not on file  Social Needs  . Financial resource strain: Not on file  . Food insecurity:    Worry: Not on file    Inability: Not on file  . Transportation needs:    Medical: Not on file    Non-medical: Not on file  Tobacco Use  . Smoking status: Never Smoker  . Smokeless tobacco: Never Used  Substance and Sexual Activity  . Alcohol use: Yes    Comment: occ  . Drug use: No  . Sexual activity: Yes    Birth control/protection: Post-menopausal  Lifestyle  . Physical activity:    Days per week: Not on file    Minutes per session: Not on file  . Stress: Not on file  Relationships  . Social connections:    Talks on phone: Not on file    Gets together: Not on file    Attends religious service: Not on file    Active member of club or organization: Not on file    Attends meetings of clubs or organizations: Not on file    Relationship status: Not on file  . Intimate partner violence:    Fear of current or ex partner: Not on file    Emotionally abused: Not on file    Physically abused: Not on file    Forced sexual activity: Not on file  Other Topics Concern  . Not on file  Social History Narrative  . Not on file    Review of Systems: See HPI,  otherwise negative ROS  Physical Exam: BP (!) 155/105   Pulse (!) 107   Temp 97.8 F (36.6 C) (Tympanic)   Resp 18   Ht 5\' 7"  (1.702 m)   Wt 108 kg   SpO2 98%   BMI 37.28 kg/m  General:   Alert,  pleasant and cooperative in NAD Head:  Normocephalic and atraumatic. Neck:  Supple; no masses or thyromegaly. Lungs:  Clear throughout to auscultation, normal respiratory effort.    Heart:  +S1, +S2, Regular rate and rhythm, No edema. Abdomen:  Soft, nontender and nondistended. Normal bowel sounds, without guarding, and without rebound.   Neurologic:  Alert and  oriented x4;  grossly normal neurologically.  Impression/Plan: Mercedes Carter is here for an colonoscopy to be performed for surveillance due to prior history of colon polyps   Risks, benefits, limitations, and alternatives regarding  colonoscopy have been reviewed with the patient.  Questions have been answered.  All parties agreeable.   [name], MD  04/21/2018, 8:56 AM

## 2018-04-21 NOTE — Anesthesia Post-op Follow-up Note (Signed)
Anesthesia QCDR form completed.        

## 2018-04-21 NOTE — Transfer of Care (Signed)
Immediate Anesthesia Transfer of Care Note  Patient: Mercedes Carter  Procedure(s) Performed: COLONOSCOPY WITH PROPOFOL (N/A )  Patient Location: PACU  Anesthesia Type:General  Level of Consciousness: sedated  Airway & Oxygen Therapy: Patient Spontanous Breathing and Patient connected to nasal cannula oxygen  Post-op Assessment: Report given to RN and Post -op Vital signs reviewed and stable  Post vital signs: Reviewed and stable  Last Vitals:  Vitals Value Taken Time  BP 99/56 04/21/2018  9:37 AM  Temp 36.2 C 04/21/2018  9:37 AM  Pulse 94 04/21/2018  9:37 AM  Resp 15 04/21/2018  9:37 AM  SpO2 97 % 04/21/2018  9:37 AM  Vitals shown include unvalidated device data.  Last Pain:  Vitals:   04/21/18 0936  TempSrc: Tympanic  PainSc: 0-No pain         Complications: No apparent anesthesia complications

## 2018-04-21 NOTE — Anesthesia Preprocedure Evaluation (Signed)
Anesthesia Evaluation  Patient identified by MRN, date of birth, ID band Patient awake    Reviewed: Allergy & Precautions, H&P , NPO status , Patient's Chart, lab work & pertinent test results, reviewed documented beta blocker date and time   History of Anesthesia Complications (+) Family history of anesthesia reaction and history of anesthetic complications  Airway Mallampati: II   Neck ROM: full    Dental  (+) Poor Dentition   Pulmonary neg pulmonary ROS, asthma , sleep apnea and Continuous Positive Airway Pressure Ventilation ,    Pulmonary exam normal        Cardiovascular Exercise Tolerance: Poor hypertension, On Medications negative cardio ROS Normal cardiovascular exam Rhythm:regular Rate:Normal     Neuro/Psych negative neurological ROS  negative psych ROS   GI/Hepatic negative GI ROS, Neg liver ROS, GERD  Medicated,  Endo/Other  negative endocrine ROSHypothyroidism   Renal/GU negative Renal ROS  negative genitourinary   Musculoskeletal   Abdominal   Peds  Hematology negative hematology ROS (+)   Anesthesia Other Findings Past Medical History: No date: Arthritis No date: Asthma     Comment:  no problems in a year 62/06/5595: Complication of anesthesia     Comment:  ALOC  DIFFICULT WAKING UP No date: Family history of anesthesia complication     Comment:  mother had difficulty waking No date: GERD (gastroesophageal reflux disease) No date: Hepatic steatosis No date: Hepatomegaly No date: Hyperlipemia No date: Hypertension     Comment:  [facility] PCP- had pt. on HCTZ, but took herself off  2 yrs.               ago No date: Hypothyroidism No date: Incontinence of urine No date: Sleep apnea     Comment:  has CPAP but does not use Past Surgical History: No date: arthroscopic  knee     Comment:  right knee No date: CHOLECYSTECTOMY 06/05/2017: COLONOSCOPY WITH PROPOFOL; N/A     Comment:  Procedure:  COLONOSCOPY WITH PROPOFOL;  Surgeon: [name], MD;  Location: [facility] ENDOSCOPY;  Service:               Gastroenterology;  Laterality: N/A; 02/18/2012: TOTAL KNEE ARTHROPLASTY 02/18/2012: TOTAL KNEE ARTHROPLASTY     Comment:  Procedure: TOTAL KNEE ARTHROPLASTY;  Surgeon: Meredith Pel, MD;  Location: [facility];  Service: Orthopedics;               Laterality: Right;  Right total knee arthroplasty   Reproductive/Obstetrics negative OB ROS                             Anesthesia Physical Anesthesia Plan  ASA: III  Anesthesia Plan: General   Post-op Pain Management:    Induction:   PONV Risk Score and Plan:   Airway Management Planned:   Additional Equipment:   Intra-op Plan:   Post-operative Plan:   Informed Consent: I have reviewed the patients History and Physical, chart, labs and discussed the procedure including the risks, benefits and alternatives for the proposed anesthesia with the patient or authorized representative who has indicated his/her understanding and acceptance.   Dental Advisory Given  Plan Discussed with: CRNA  Anesthesia Plan Comments:         Anesthesia Quick Evaluation

## 2018-04-21 NOTE — Op Note (Signed)
[facility]astroenter[facility] Patient Name: Mercedes Carter Procedure Date: 04/21/2018 8:58 AM MRN: [id number] Account #: [id number] Date of Birth: [birth year] Admit Type: Outpatient Age: 63 Room: [facility] ENDO ROOM 1 Gender: Female Note Status: Finalized Procedure:            Colonoscopy Indications:          High risk colon cancer surveillance: Personal history                        of colonic polyps Providers:            [name] MD, MD Medicines:            Monitored Anesthesia Care Complications:        No immediate complications. Procedure:            Pre-Anesthesia Assessment:                       - Prior to the procedure, a History and Physical was                        performed, and patient medications, allergies and                        sensitivities were reviewed. The patient's tolerance of                        previous anesthesia was reviewed.                       - The risks and benefits of the procedure and the                        sedation options and risks were discussed with the                        patient. All questions were answered and informed                        consent was obtained.                       - ASA Grade Assessment: II - A patient with mild                        systemic disease.                       After obtaining informed consent, the colonoscope was                        passed under direct vision. Throughout the procedure,                        the patient's blood pressure, pulse, and oxygen                        saturations were monitored continuously. The                        Colonoscope was introduced through the anus and  advanced to the the cecum, identified by the                        appendiceal orifice, IC valve and transillumination.                        The colonoscopy was performed with ease. The patient                        tolerated the procedure well. The quality  of the bowel                        preparation was good. Findings:      The perianal and digital rectal examinations were normal.      A 5 mm polyp was found in the ascending colon. The polyp was sessile.       The polyp was removed with a cold snare. Resection and retrieval were       complete.      The exam was otherwise without abnormality on direct and retroflexion       views. Impression:           - One 5 mm polyp in the ascending colon, removed with a                        cold snare. Resected and retrieved.                       - The examination was otherwise normal on direct and                        retroflexion views. Recommendation:       - Discharge patient to home (with escort).                       - Resume previous diet.                       - Continue present medications.                       - Await pathology results.                       - Repeat colonoscopy in 3 years for surveillance. Procedure Code(s):    --- Professional ---                       [phone], Colonoscopy, flexible; with removal of tumor(s),                        polyp(s), or other lesion(s) by snare technique Diagnosis Code(s):    --- Professional ---                       Z86.010, Personal history of colonic polyps                       D12.2, Benign neoplasm of ascending colon CPT copyright 2018 American Medical Association. All rights reserved. The codes documented in this report are preliminary and upon coder review may  be revised to meet current compliance requirements. [name], MD  [name] MD, MD 04/21/2018 9:28:25 AM This report has been signed electronically. Number of Addenda: 0 Note Initiated On: 04/21/2018 8:58 AM Scope Withdrawal Time: 0 hours 11 minutes 56 seconds  Total Procedure Duration: 0 hours 13 minutes 37 seconds       [facility]

## 2018-04-21 NOTE — Anesthesia Postprocedure Evaluation (Signed)
Anesthesia Post Note  Patient: Mercedes Carter  Procedure(s) Performed: COLONOSCOPY WITH PROPOFOL (N/A )  Patient location during evaluation: PACU Anesthesia Type: General Level of consciousness: awake and alert Pain management: pain level controlled Vital Signs Assessment: post-procedure vital signs reviewed and stable Respiratory status: spontaneous breathing, nonlabored ventilation, respiratory function stable and patient connected to nasal cannula oxygen Cardiovascular status: blood pressure returned to baseline and stable Postop Assessment: no apparent nausea or vomiting Anesthetic complications: no     Last Vitals:  Vitals:   04/21/18 0936 04/21/18 0937  BP: (!) 99/56 (!) 99/56  Pulse: 97 93  Resp: 20 10  Temp: (!) 36.1 C (!) 36.2 C  SpO2: 98% 100%    Last Pain:  Vitals:   04/21/18 1004  TempSrc:   PainSc: 0-No pain                 Molli Barrows

## 2018-04-22 ENCOUNTER — Encounter: Payer: Self-pay | Admitting: Gastroenterology

## 2018-04-22 LAB — SURGICAL PATHOLOGY

## 2018-04-28 ENCOUNTER — Encounter: Payer: Self-pay | Admitting: Gastroenterology

## 2019-05-07 ENCOUNTER — Other Ambulatory Visit: Payer: Self-pay | Admitting: Nurse Practitioner

## 2019-05-07 DIAGNOSIS — Z1231 Encounter for screening mammogram for malignant neoplasm of breast: Secondary | ICD-10-CM

## 2019-06-07 DIAGNOSIS — Z6841 Body Mass Index (BMI) 40.0 and over, adult: Secondary | ICD-10-CM | POA: Diagnosis not present

## 2019-06-07 DIAGNOSIS — M170 Bilateral primary osteoarthritis of knee: Secondary | ICD-10-CM | POA: Diagnosis not present

## 2019-06-07 DIAGNOSIS — R251 Tremor, unspecified: Secondary | ICD-10-CM | POA: Diagnosis not present

## 2019-06-07 DIAGNOSIS — M791 Myalgia, unspecified site: Secondary | ICD-10-CM | POA: Diagnosis not present

## 2019-06-08 ENCOUNTER — Ambulatory Visit
Admission: RE | Admit: 2019-06-08 | Discharge: 2019-06-08 | Disposition: A | Discharge status: Home / Self Care | Payer: Medicare PPO | Source: Ambulatory Visit | Attending: Nurse Practitioner | Admitting: Nurse Practitioner

## 2019-06-08 DIAGNOSIS — Z1231 Encounter for screening mammogram for malignant neoplasm of breast: Secondary | ICD-10-CM | POA: Diagnosis not present

## 2019-07-21 DIAGNOSIS — Z20822 Contact with and (suspected) exposure to covid-19: Secondary | ICD-10-CM | POA: Diagnosis not present

## 2019-10-06 DIAGNOSIS — E785 Hyperlipidemia, unspecified: Secondary | ICD-10-CM | POA: Diagnosis not present

## 2019-10-06 DIAGNOSIS — R739 Hyperglycemia, unspecified: Secondary | ICD-10-CM | POA: Diagnosis not present

## 2019-10-06 DIAGNOSIS — F329 Major depressive disorder, single episode, unspecified: Secondary | ICD-10-CM | POA: Diagnosis not present

## 2019-10-06 DIAGNOSIS — K219 Gastro-esophageal reflux disease without esophagitis: Secondary | ICD-10-CM | POA: Diagnosis not present

## 2019-10-06 DIAGNOSIS — E039 Hypothyroidism, unspecified: Secondary | ICD-10-CM | POA: Diagnosis not present

## 2019-10-06 DIAGNOSIS — M109 Gout, unspecified: Secondary | ICD-10-CM | POA: Diagnosis not present

## 2019-10-06 DIAGNOSIS — I10 Essential (primary) hypertension: Secondary | ICD-10-CM | POA: Diagnosis not present

## 2019-10-06 DIAGNOSIS — F419 Anxiety disorder, unspecified: Secondary | ICD-10-CM | POA: Diagnosis not present

## 2019-10-06 DIAGNOSIS — J9801 Acute bronchospasm: Secondary | ICD-10-CM | POA: Diagnosis not present

## 2019-10-06 DIAGNOSIS — M170 Bilateral primary osteoarthritis of knee: Secondary | ICD-10-CM | POA: Diagnosis not present

## 2019-10-15 DIAGNOSIS — H8111 Benign paroxysmal vertigo, right ear: Secondary | ICD-10-CM | POA: Diagnosis not present

## 2019-10-15 DIAGNOSIS — H903 Sensorineural hearing loss, bilateral: Secondary | ICD-10-CM | POA: Diagnosis not present

## 2019-12-17 DIAGNOSIS — D1801 Hemangioma of skin and subcutaneous tissue: Secondary | ICD-10-CM | POA: Diagnosis not present

## 2019-12-17 DIAGNOSIS — Z20822 Contact with and (suspected) exposure to covid-19: Secondary | ICD-10-CM | POA: Diagnosis not present

## 2019-12-17 DIAGNOSIS — D229 Melanocytic nevi, unspecified: Secondary | ICD-10-CM | POA: Diagnosis not present

## 2019-12-24 ENCOUNTER — Ambulatory Visit: Payer: Self-pay

## 2019-12-24 ENCOUNTER — Ambulatory Visit: Payer: Medicare PPO | Admitting: Orthopedic Surgery

## 2019-12-24 ENCOUNTER — Encounter: Payer: Self-pay | Admitting: Orthopedic Surgery

## 2019-12-24 ENCOUNTER — Ambulatory Visit (HOSPITAL_COMMUNITY): Admission: RE | Admit: 2019-12-24 | Payer: Medicare PPO | Source: Ambulatory Visit

## 2019-12-24 VITALS — Ht 67.0 in | Wt 265.0 lb

## 2019-12-24 DIAGNOSIS — M79605 Pain in left leg: Secondary | ICD-10-CM | POA: Diagnosis not present

## 2019-12-24 DIAGNOSIS — M1712 Unilateral primary osteoarthritis, left knee: Secondary | ICD-10-CM | POA: Diagnosis not present

## 2019-12-24 DIAGNOSIS — M25561 Pain in right knee: Secondary | ICD-10-CM

## 2019-12-24 DIAGNOSIS — M25562 Pain in left knee: Secondary | ICD-10-CM

## 2019-12-24 MED ORDER — BUPIVACAINE HCL 0.25 % IJ SOLN
4.0000 mL | INTRAMUSCULAR | Status: AC | PRN
  Administered 2019-12-24: 4 mL via INTRA_ARTICULAR

## 2019-12-24 MED ORDER — METHYLPREDNISOLONE ACETATE 40 MG/ML IJ SUSP
40.0000 mg | INTRAMUSCULAR | Status: AC | PRN
  Administered 2019-12-24: 40 mg via INTRA_ARTICULAR

## 2019-12-24 MED ORDER — LIDOCAINE HCL 1 % IJ SOLN
5.0000 mL | INTRAMUSCULAR | Status: AC | PRN
  Administered 2019-12-24: 5 mL

## 2019-12-24 NOTE — Progress Notes (Addendum)
Office Visit Note   Patient: Mercedes Carter           Date of Birth: 12/31/1955           MRN: [id number] Visit Date: 12/24/2019 Requested by: [facility] [place],  [place] [id number] PCP: [facility]  Subjective: Chief Complaint  Patient presents with  . Left Knee - Pain  . Right Knee - Pain    HPI: Mercedes Carter is a 64 year old patient with bilateral knee pain left worse than right.  Reports a grabbing sensation in her right knee.  Has a history of right total knee replacement done about 10 years ago.  Also reports left knee pain.  Does not want to have her left knee replaced even though she has severe arthritis in that knee.  She describes symptoms of giving way in the left knee.  Plays disc golf once a week but has to stop and rest a week after she plays because of knee pain.  Went on a keto diet went down from 300-2 19 but went off the diet and has gained some of that weight back.  Also reports mild calf discomfort but has had no history of immobilization or history of DVT or pulmonary embolism.  Patient weighs 120 kg.  She may require custom brace due to her high thigh to calf ratio on the affected side.              ROS: All systems reviewed are negative as they relate to the chief complaint within the history of present illness.  Patient denies  fevers or chills.   Assessment & Plan: Visit Diagnoses:  1. Pain in both knees, unspecified chronicity   2. Pain in left leg     Plan: Impression is left knee arthritis symptomatic with right knee replacement which is occasionally symptomatic with sharp pains but has no definite radiographic or clinical abnormality.  Plan is ultrasound left lower extremity rule out DVT with low index of suspicion for a problem.  Left knee is also injected.  Follow-up as needed.  Follow-Up Instructions: Return if symptoms worsen or fail to improve.   Orders:  Orders Placed This Encounter  Procedures   . XR Knee 1-2 Views Right  . XR KNEE 3 VIEW LEFT  . VAS [place] LOWER EXTREMITY VENOUS (DVT)   No orders of the defined types were placed in this encounter.     Procedures: Large Joint Inj: L knee on 12/24/2019 11:45 PM Indications: diagnostic evaluation, joint swelling and pain Details: 18 G 1.5 in needle, superolateral approach  Arthrogram: No  Medications: 5 mL lidocaine 1 %; 40 mg methylPREDNISolone acetate 40 MG/ML; 4 mL bupivacaine 0.25 % Outcome: tolerated well, no immediate complications Procedure, treatment alternatives, risks and benefits explained, specific risks discussed. Consent was given by the patient. Immediately prior to procedure a time out was called to verify the correct patient, procedure, equipment, support staff and site/side marked as required. Patient was prepped and draped in the usual sterile fashion.       Clinical Data: No additional findings.  Objective: Vital Signs: Ht 5\' 7"  (1.702 m)   Wt 265 lb (120.2 kg)   BMI 41.50 kg/m   Physical Exam:   Constitutional: Patient appears well-developed HEENT:  Head: Normocephalic Eyes:EOM are normal Neck: Normal range of motion Cardiovascular: Normal rate Pulmonary/chest: Effort normal Neurologic: Patient is alert Skin: Skin is warm Psychiatric: Patient has normal mood and affect  Ortho Exam: Ortho exam demonstrates slightly antalgic gait to the left with no groin pain with internal X rotation of the leg.  No effusion in either knee.  Right knee has good range of motion good stability to varus valgus stress at 0 39 degrees with intact extensor mechanism and palpable pedal pulses.  Left knee demonstrates slight varus alignment with no effusion medial joint line tenderness and range of motion from 5 to 110 degrees.  She does have increased opening and mild instability varus stress at both 0 and 30 degrees compared to the right knee.  Slight calf tenderness to palpation with some spider veins present.  No  asymmetry left calf versus right calf.  Negative Homans.  Specialty Comments:  No specialty comments available.  Imaging: XR Knee 1-2 Views Right  Result Date: 12/24/2019 AP lateral right knee reviewed.  Total knee prosthesis in good position alignment with no complicating features.  No loosening around the bone cement mantle interface alignment intact  XR KNEE 3 VIEW LEFT  Result Date: 12/24/2019 AP lateral merchant left knee reviewed.  End-stage tricompartmental arthritis is present worse in the medial compartment.  Slight varus alignment.  No acute fracture.    PMFS History: Patient Active Problem List   Diagnosis Date Noted  . Obesity (BMI 35.0-39.9 without comorbidity) 04/10/2017  . Condyloma 04/10/2017  . Family history of colon cancer 04/10/2017  . Family history of ovarian cancer 04/10/2017  . Pelvic pain 04/10/2017  . STD exposure 04/10/2017  . Acute respiratory failure with hypoxia ([place]) 02/18/2012  . Hypercarbia 02/18/2012  . Respiratory failure, post-operative ([place]) 02/18/2012  . Asthma 02/18/2012  . Obesity 02/18/2012  . Altered mental state 02/18/2012  . Hypothyroid 02/18/2012  . OSA (obstructive sleep apnea) 02/18/2012  . 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   Past Medical History:  Diagnosis Date  . Arthritis   . Asthma    no problems in a year  . Complication of anesthesia 02/18/2012   ALOC  DIFFICULT WAKING UP  . Family history of anesthesia complication    mother had difficulty waking  . GERD (gastroesophageal reflux disease)   . Hepatic steatosis   . Hepatomegaly   . Hyperlipemia   . Hypertension    [facility] PCP- had pt. on HCTZ, but took herself off  2 yrs. ago  . Hypothyroidism   . Incontinence of urine   . Sleep apnea    has CPAP but does not use    Family History  Problem Relation Age of Onset  . Ovarian cancer Mother   . Diabetes Mother   . Ovarian cancer Sister   . Diabetes Maternal Aunt   . Breast cancer Maternal Aunt   . Colon  cancer Maternal Grandmother   . Diabetes Maternal Grandmother   . Colon cancer Other     Past Surgical History:  Procedure Laterality Date  . arthroscopic  knee     right knee  . CHOLECYSTECTOMY    . COLONOSCOPY WITH PROPOFOL N/A 06/05/2017   Procedure: COLONOSCOPY WITH PROPOFOL;  Surgeon: [name], MD;  Location: [facility] ENDOSCOPY;  Service: Gastroenterology;  Laterality: N/A;  . COLONOSCOPY WITH PROPOFOL N/A 04/21/2018   Procedure: COLONOSCOPY WITH PROPOFOL;  Surgeon: [name], MD;  Location: [facility] ENDOSCOPY;  Service: Gastroenterology;  Laterality: N/A;  . TOTAL KNEE ARTHROPLASTY  02/18/2012  . TOTAL KNEE ARTHROPLASTY  02/18/2012   Procedure: TOTAL KNEE ARTHROPLASTY;  Surgeon: Meredith Pel, MD;  Location: [facility];  Service: Orthopedics;  Laterality: Right;  Right total knee arthroplasty   Social History   Occupational History  . Not on file  Tobacco Use  . Smoking status: Never Smoker  . Smokeless tobacco: Never Used  Vaping Use  . Vaping Use: Never used  Substance and Sexual Activity  . Alcohol use: Yes    Comment: occ  . Drug use: No  . Sexual activity: Yes    Birth control/protection: Post-menopausal

## 2019-12-28 ENCOUNTER — Telehealth: Payer: Self-pay | Admitting: *Deleted

## 2019-12-28 NOTE — Telephone Encounter (Signed)
Received message from vascular lab that pt appt was cancelled due to pt was unable to contact to let her know of appt.

## 2019-12-31 ENCOUNTER — Ambulatory Visit (HOSPITAL_COMMUNITY)
Admission: RE | Admit: 2019-12-31 | Discharge: 2019-12-31 | Disposition: A | Discharge status: Home / Self Care | Payer: Medicare PPO | Source: Ambulatory Visit | Attending: Orthopedic Surgery | Admitting: Orthopedic Surgery

## 2019-12-31 ENCOUNTER — Other Ambulatory Visit: Payer: Self-pay

## 2019-12-31 ENCOUNTER — Telehealth: Payer: Self-pay

## 2019-12-31 DIAGNOSIS — M79605 Pain in left leg: Secondary | ICD-10-CM | POA: Insufficient documentation

## 2019-12-31 MED ORDER — RIVAROXABAN (XARELTO) VTE STARTER PACK (15 & 20 MG): ORAL_TABLET | ORAL | 0 refills | Status: DC

## 2019-12-31 NOTE — Progress Notes (Signed)
Lower extremity venous LT study completed.  Preliminary results relayed to April for Marlou Sa, MD.   See CV Proc for preliminary results report.   Darlin Coco, RDMS

## 2019-12-31 NOTE — Telephone Encounter (Signed)
Mercedes Carter with [facility] called with DVT results for left calf.  Stated that patient has a partial clot in the left calf and could not say whether or not it was acute or chronic.   Talked with Dr. Marlou Sa and he stated to send Rx for Xarelto, starter pack to patient's pharmacy and advise patient to see her PCP.  Patient advised of above message per Mercedes Carter.

## 2020-01-03 NOTE — Progress Notes (Signed)
Hi [name] can you make sure she got the Xarelto starter pack.  Thanks

## 2020-01-06 DIAGNOSIS — I82409 Acute embolism and thrombosis of unspecified deep veins of unspecified lower extremity: Secondary | ICD-10-CM | POA: Diagnosis not present

## 2020-01-06 DIAGNOSIS — M170 Bilateral primary osteoarthritis of knee: Secondary | ICD-10-CM | POA: Diagnosis not present

## 2020-01-06 DIAGNOSIS — R000 Tachycardia, unspecified: Secondary | ICD-10-CM | POA: Diagnosis not present

## 2020-01-06 DIAGNOSIS — Z6841 Body Mass Index (BMI) 40.0 and over, adult: Secondary | ICD-10-CM | POA: Diagnosis not present

## 2020-01-07 ENCOUNTER — Other Ambulatory Visit: Payer: Self-pay | Admitting: Nurse Practitioner

## 2020-01-07 ENCOUNTER — Ambulatory Visit
Admission: RE | Admit: 2020-01-07 | Discharge: 2020-01-07 | Disposition: A | Discharge status: Home / Self Care | Payer: Medicare PPO | Source: Ambulatory Visit | Attending: Nurse Practitioner | Admitting: Nurse Practitioner

## 2020-01-07 ENCOUNTER — Other Ambulatory Visit: Payer: Self-pay

## 2020-01-07 DIAGNOSIS — R000 Tachycardia, unspecified: Secondary | ICD-10-CM

## 2020-01-11 DIAGNOSIS — M1712 Unilateral primary osteoarthritis, left knee: Secondary | ICD-10-CM | POA: Diagnosis not present

## 2020-01-19 ENCOUNTER — Other Ambulatory Visit: Payer: Self-pay | Admitting: Nurse Practitioner

## 2020-01-19 DIAGNOSIS — R000 Tachycardia, unspecified: Secondary | ICD-10-CM

## 2020-01-20 ENCOUNTER — Ambulatory Visit
Admission: RE | Admit: 2020-01-20 | Discharge: 2020-01-20 | Disposition: A | Discharge status: Home / Self Care | Payer: Medicare PPO | Source: Ambulatory Visit | Attending: Nurse Practitioner | Admitting: Nurse Practitioner

## 2020-01-20 ENCOUNTER — Other Ambulatory Visit: Payer: Medicare PPO

## 2020-01-20 ENCOUNTER — Other Ambulatory Visit: Payer: Self-pay

## 2020-01-20 DIAGNOSIS — R000 Tachycardia, unspecified: Secondary | ICD-10-CM

## 2020-01-20 DIAGNOSIS — I700 Atherosclerosis of aorta: Secondary | ICD-10-CM | POA: Diagnosis not present

## 2020-01-20 DIAGNOSIS — R0602 Shortness of breath: Secondary | ICD-10-CM | POA: Diagnosis not present

## 2020-01-20 MED ORDER — IOPAMIDOL (ISOVUE-370) INJECTION 76%
75.0000 mL | Freq: Once | INTRAVENOUS | Status: AC | PRN
  Administered 2020-01-20: 75 mL via INTRAVENOUS

## 2020-02-01 ENCOUNTER — Other Ambulatory Visit: Payer: Medicare PPO

## 2020-02-11 ENCOUNTER — Telehealth: Payer: Self-pay

## 2020-02-11 ENCOUNTER — Other Ambulatory Visit: Payer: Self-pay | Admitting: Surgical

## 2020-02-11 DIAGNOSIS — I82409 Acute embolism and thrombosis of unspecified deep veins of unspecified lower extremity: Secondary | ICD-10-CM | POA: Diagnosis not present

## 2020-02-11 DIAGNOSIS — N3946 Mixed incontinence: Secondary | ICD-10-CM | POA: Diagnosis not present

## 2020-02-11 DIAGNOSIS — M170 Bilateral primary osteoarthritis of knee: Secondary | ICD-10-CM | POA: Diagnosis not present

## 2020-02-11 DIAGNOSIS — Z6841 Body Mass Index (BMI) 40.0 and over, adult: Secondary | ICD-10-CM | POA: Diagnosis not present

## 2020-02-11 NOTE — Telephone Encounter (Signed)
Patient called inn saying she received her brace in the mail wanted to know when can she get fitted for it . Also had questions regarding her blood clot  meds and also wanted to get pain meds prescribed

## 2020-02-11 NOTE — Telephone Encounter (Signed)
Called the patient to discuss.  She received an unloader brace.  Regarding her blood clot, she states that she has acute [place] worsened pain in the same calf that she was complaining of pain of at her last office visit.  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  With the acutely worsening pain over the last couple days, she reported to her primary care physician today who refilled her Xarelto and states that "she will have to be on these for about 6 months".  She has reportedly had a CT scan of her lungs that was negative for any pulmonary emboli.  She has pain medication prescribed by her primary care physician that she is using sparingly.  Discussed patient's symptoms.  Informed her that despite her severe osteoarthritis, we will hold off on doing any sort of knee replacement until her blood clot is resolved.  Recommended that she report to the ER if she experiences any shortness of breath or her calf pain continues to worsen.  Patient agreed with this plan.  She is not experiencing any shortness of breath or lung symptoms at this time.

## 2020-02-24 DIAGNOSIS — F419 Anxiety disorder, unspecified: Secondary | ICD-10-CM | POA: Diagnosis not present

## 2020-02-24 DIAGNOSIS — Z1331 Encounter for screening for depression: Secondary | ICD-10-CM | POA: Diagnosis not present

## 2020-02-24 DIAGNOSIS — M170 Bilateral primary osteoarthritis of knee: Secondary | ICD-10-CM | POA: Diagnosis not present

## 2020-02-24 DIAGNOSIS — I82409 Acute embolism and thrombosis of unspecified deep veins of unspecified lower extremity: Secondary | ICD-10-CM | POA: Diagnosis not present

## 2020-02-24 DIAGNOSIS — F32A Depression, unspecified: Secondary | ICD-10-CM | POA: Diagnosis not present

## 2020-02-24 DIAGNOSIS — Z6841 Body Mass Index (BMI) 40.0 and over, adult: Secondary | ICD-10-CM | POA: Diagnosis not present

## 2020-02-24 DIAGNOSIS — Z9181 History of falling: Secondary | ICD-10-CM | POA: Diagnosis not present

## 2020-02-28 ENCOUNTER — Telehealth: Payer: Self-pay

## 2020-02-28 DIAGNOSIS — M79605 Pain in left leg: Secondary | ICD-10-CM

## 2020-02-28 NOTE — Telephone Encounter (Signed)
Patient came in she stated she was told to schedule an appointment for brace fitting and to schedule another ultrasound PER LUKE. CB:[phone]

## 2020-02-29 NOTE — Telephone Encounter (Signed)
Can you please clarify on this? Patient not seen since Sept.

## 2020-03-01 NOTE — Telephone Encounter (Signed)
I spoke with patient at length. She said she followed up with her PCP about DVT but they told her they would not manage this-gave her samples for blood thinner to last her til December and her PCP will not schedule her a follow up for doppler for her DVT and told her that once she finished medication in December she would not need any additional medication nor a follow up doppler. She is asking one of you to call her to discuss.

## 2020-03-01 NOTE — Telephone Encounter (Signed)
We discussed her unloader brace and her history of DVT on a phone call several weeks ago.  She can come in to discuss her brace fit and how it affects her knee sx's if she would like.  Regarding her blood clot and use of blood thinners, I think a discussion with her PCP would be more helpful but she's welcome to bring that up at a visit as well.

## 2020-03-07 NOTE — Telephone Encounter (Signed)
`   I called Mercedes Carter.  I suggested that would be a good idea for her to come in and get fitted for her unloader brace.  Regarding her DVT she would like to get knee replacement.  From my perspective I recommend that she get an ultrasound in early January to confirm that the blood clot has resolved.  That would be what I would require before considering knee replacement..  Please schedule her for ultrasound in early January to rule out DVT in the affected leg thanks

## 2020-03-08 ENCOUNTER — Telehealth: Payer: Self-pay | Admitting: Orthopedic Surgery

## 2020-03-08 NOTE — Telephone Encounter (Signed)
Tried calling patient. No answer. No VM to LM. See other note for further documentation. Will try to call patient again later.

## 2020-03-08 NOTE — Telephone Encounter (Signed)
Tried calling patient. No answer. No VM to LM Per Dr Marlou Sa ok to cancel appt for Monday. Also put in order for doppler to be done in January.

## 2020-03-08 NOTE — Telephone Encounter (Signed)
Pt called wanting to know if she was supposed to call the man to set an appt for her brace fitting and if so she would like his number? Pt also states she had a convo with Dr.Dean and wasn't sure if she was supposed to keep her appt on 03/13/20. She would like a CB with the number and answer about her upcoming appt.   [phone]

## 2020-03-08 NOTE — Addendum Note (Signed)
Addended byLaurann Montana on: 03/08/2020 02:49 PM   Modules accepted: Orders

## 2020-03-13 ENCOUNTER — Ambulatory Visit: Payer: Medicare PPO | Admitting: Orthopedic Surgery

## 2020-03-17 NOTE — Telephone Encounter (Signed)
Tried calling again.

## 2020-04-26 DIAGNOSIS — Z20822 Contact with and (suspected) exposure to covid-19: Secondary | ICD-10-CM | POA: Diagnosis not present

## 2020-04-28 DIAGNOSIS — U071 COVID-19: Secondary | ICD-10-CM | POA: Diagnosis not present

## 2020-04-28 DIAGNOSIS — J029 Acute pharyngitis, unspecified: Secondary | ICD-10-CM | POA: Diagnosis not present

## 2020-06-27 DIAGNOSIS — M79662 Pain in left lower leg: Secondary | ICD-10-CM | POA: Diagnosis not present

## 2020-06-27 DIAGNOSIS — E785 Hyperlipidemia, unspecified: Secondary | ICD-10-CM | POA: Diagnosis not present

## 2020-06-27 DIAGNOSIS — F419 Anxiety disorder, unspecified: Secondary | ICD-10-CM | POA: Diagnosis not present

## 2020-06-27 DIAGNOSIS — F32A Depression, unspecified: Secondary | ICD-10-CM | POA: Diagnosis not present

## 2020-06-27 DIAGNOSIS — J9801 Acute bronchospasm: Secondary | ICD-10-CM | POA: Diagnosis not present

## 2020-06-27 DIAGNOSIS — K219 Gastro-esophageal reflux disease without esophagitis: Secondary | ICD-10-CM | POA: Diagnosis not present

## 2020-06-27 DIAGNOSIS — M109 Gout, unspecified: Secondary | ICD-10-CM | POA: Diagnosis not present

## 2020-06-27 DIAGNOSIS — M170 Bilateral primary osteoarthritis of knee: Secondary | ICD-10-CM | POA: Diagnosis not present

## 2020-06-27 DIAGNOSIS — E039 Hypothyroidism, unspecified: Secondary | ICD-10-CM | POA: Diagnosis not present

## 2020-06-27 DIAGNOSIS — I10 Essential (primary) hypertension: Secondary | ICD-10-CM | POA: Diagnosis not present

## 2020-06-28 ENCOUNTER — Other Ambulatory Visit: Payer: Self-pay | Admitting: Nurse Practitioner

## 2020-06-28 DIAGNOSIS — Z86718 Personal history of other venous thrombosis and embolism: Secondary | ICD-10-CM

## 2020-06-28 DIAGNOSIS — M79661 Pain in right lower leg: Secondary | ICD-10-CM

## 2020-06-28 DIAGNOSIS — N644 Mastodynia: Secondary | ICD-10-CM

## 2020-07-05 ENCOUNTER — Other Ambulatory Visit: Payer: Self-pay | Admitting: Nurse Practitioner

## 2020-07-05 DIAGNOSIS — Z1231 Encounter for screening mammogram for malignant neoplasm of breast: Secondary | ICD-10-CM

## 2020-07-05 DIAGNOSIS — N644 Mastodynia: Secondary | ICD-10-CM

## 2020-07-12 ENCOUNTER — Other Ambulatory Visit: Payer: Medicare PPO

## 2020-07-13 ENCOUNTER — Other Ambulatory Visit: Payer: Medicare PPO

## 2020-07-20 ENCOUNTER — Other Ambulatory Visit: Payer: Self-pay

## 2020-07-20 ENCOUNTER — Other Ambulatory Visit: Payer: Self-pay | Admitting: Nurse Practitioner

## 2020-07-20 ENCOUNTER — Ambulatory Visit
Admission: RE | Admit: 2020-07-20 | Discharge: 2020-07-20 | Disposition: A | Discharge status: Home / Self Care | Payer: Medicare PPO | Source: Ambulatory Visit | Attending: Nurse Practitioner | Admitting: Nurse Practitioner

## 2020-07-20 DIAGNOSIS — M79661 Pain in right lower leg: Secondary | ICD-10-CM

## 2020-07-20 DIAGNOSIS — Z86718 Personal history of other venous thrombosis and embolism: Secondary | ICD-10-CM | POA: Insufficient documentation

## 2020-07-20 DIAGNOSIS — M79672 Pain in left foot: Secondary | ICD-10-CM | POA: Diagnosis not present

## 2020-07-20 DIAGNOSIS — M79662 Pain in left lower leg: Secondary | ICD-10-CM | POA: Diagnosis not present

## 2020-07-20 DIAGNOSIS — M79605 Pain in left leg: Secondary | ICD-10-CM | POA: Diagnosis not present

## 2020-08-28 ENCOUNTER — Ambulatory Visit: Payer: Medicare PPO | Admitting: Orthopedic Surgery

## 2020-09-11 DIAGNOSIS — Z9181 History of falling: Secondary | ICD-10-CM | POA: Diagnosis not present

## 2020-09-11 DIAGNOSIS — E785 Hyperlipidemia, unspecified: Secondary | ICD-10-CM | POA: Diagnosis not present

## 2020-09-11 DIAGNOSIS — Z1331 Encounter for screening for depression: Secondary | ICD-10-CM | POA: Diagnosis not present

## 2020-09-11 DIAGNOSIS — E669 Obesity, unspecified: Secondary | ICD-10-CM | POA: Diagnosis not present

## 2020-09-11 DIAGNOSIS — Z0000 Encounter for general adult medical examination without abnormal findings: Secondary | ICD-10-CM | POA: Diagnosis not present

## 2020-09-11 DIAGNOSIS — Z139 Encounter for screening, unspecified: Secondary | ICD-10-CM | POA: Diagnosis not present

## 2020-09-15 DIAGNOSIS — G4733 Obstructive sleep apnea (adult) (pediatric): Secondary | ICD-10-CM | POA: Diagnosis not present

## 2020-09-15 DIAGNOSIS — F32A Depression, unspecified: Secondary | ICD-10-CM | POA: Diagnosis not present

## 2020-09-15 DIAGNOSIS — F419 Anxiety disorder, unspecified: Secondary | ICD-10-CM | POA: Diagnosis not present

## 2020-09-15 DIAGNOSIS — M170 Bilateral primary osteoarthritis of knee: Secondary | ICD-10-CM | POA: Diagnosis not present

## 2020-09-15 DIAGNOSIS — E039 Hypothyroidism, unspecified: Secondary | ICD-10-CM | POA: Diagnosis not present

## 2020-09-15 DIAGNOSIS — I10 Essential (primary) hypertension: Secondary | ICD-10-CM | POA: Diagnosis not present

## 2020-09-15 DIAGNOSIS — R0602 Shortness of breath: Secondary | ICD-10-CM | POA: Diagnosis not present

## 2020-09-15 DIAGNOSIS — G4700 Insomnia, unspecified: Secondary | ICD-10-CM | POA: Diagnosis not present

## 2020-09-15 DIAGNOSIS — H8109 Meniere's disease, unspecified ear: Secondary | ICD-10-CM | POA: Diagnosis not present

## 2020-09-28 ENCOUNTER — Encounter: Payer: Self-pay | Admitting: *Deleted

## 2020-09-28 ENCOUNTER — Encounter: Payer: Self-pay | Admitting: Cardiology

## 2020-10-20 DIAGNOSIS — M1710 Unilateral primary osteoarthritis, unspecified knee: Secondary | ICD-10-CM | POA: Insufficient documentation

## 2020-10-20 DIAGNOSIS — R632 Polyphagia: Secondary | ICD-10-CM | POA: Insufficient documentation

## 2020-10-20 DIAGNOSIS — I10 Essential (primary) hypertension: Secondary | ICD-10-CM | POA: Insufficient documentation

## 2020-10-20 DIAGNOSIS — F32A Depression, unspecified: Secondary | ICD-10-CM | POA: Insufficient documentation

## 2020-10-20 DIAGNOSIS — R32 Unspecified urinary incontinence: Secondary | ICD-10-CM | POA: Insufficient documentation

## 2020-10-20 DIAGNOSIS — Z8669 Personal history of other diseases of the nervous system and sense organs: Secondary | ICD-10-CM | POA: Insufficient documentation

## 2020-10-20 DIAGNOSIS — K760 Fatty (change of) liver, not elsewhere classified: Secondary | ICD-10-CM | POA: Insufficient documentation

## 2020-10-20 DIAGNOSIS — R160 Hepatomegaly, not elsewhere classified: Secondary | ICD-10-CM | POA: Insufficient documentation

## 2020-10-20 DIAGNOSIS — M1990 Unspecified osteoarthritis, unspecified site: Secondary | ICD-10-CM | POA: Insufficient documentation

## 2020-10-20 DIAGNOSIS — M179 Osteoarthritis of knee, unspecified: Secondary | ICD-10-CM | POA: Insufficient documentation

## 2020-10-20 DIAGNOSIS — E785 Hyperlipidemia, unspecified: Secondary | ICD-10-CM | POA: Insufficient documentation

## 2020-10-20 DIAGNOSIS — M5440 Lumbago with sciatica, unspecified side: Secondary | ICD-10-CM | POA: Insufficient documentation

## 2020-10-20 DIAGNOSIS — I82409 Acute embolism and thrombosis of unspecified deep veins of unspecified lower extremity: Secondary | ICD-10-CM | POA: Insufficient documentation

## 2020-10-20 DIAGNOSIS — Z8489 Family history of other specified conditions: Secondary | ICD-10-CM | POA: Insufficient documentation

## 2020-10-20 DIAGNOSIS — K219 Gastro-esophageal reflux disease without esophagitis: Secondary | ICD-10-CM | POA: Insufficient documentation

## 2020-10-20 DIAGNOSIS — R739 Hyperglycemia, unspecified: Secondary | ICD-10-CM | POA: Insufficient documentation

## 2020-10-23 ENCOUNTER — Encounter: Payer: Self-pay | Admitting: Cardiology

## 2020-10-23 ENCOUNTER — Ambulatory Visit: Payer: Medicare PPO | Admitting: Cardiology

## 2020-10-23 ENCOUNTER — Other Ambulatory Visit: Payer: Self-pay

## 2020-10-23 VITALS — BP 148/98 | HR 120 | Ht 67.0 in | Wt 253.4 lb

## 2020-10-23 DIAGNOSIS — E782 Mixed hyperlipidemia: Secondary | ICD-10-CM

## 2020-10-23 DIAGNOSIS — I10 Essential (primary) hypertension: Secondary | ICD-10-CM | POA: Diagnosis not present

## 2020-10-23 DIAGNOSIS — E039 Hypothyroidism, unspecified: Secondary | ICD-10-CM | POA: Diagnosis not present

## 2020-10-23 DIAGNOSIS — I700 Atherosclerosis of aorta: Secondary | ICD-10-CM | POA: Diagnosis not present

## 2020-10-23 HISTORY — DX: Morbid (severe) obesity due to excess calories: E66.01

## 2020-10-23 HISTORY — DX: Atherosclerosis of aorta: I70.0

## 2020-10-23 MED ORDER — METOPROLOL SUCCINATE ER 50 MG PO TB24: 50.0000 mg | ORAL_TABLET | Freq: Every day | ORAL | 3 refills | Status: DC

## 2020-10-23 NOTE — Patient Instructions (Signed)
Medication Instructions:  Your physician has recommended you make the following change in your medication:   Stop Hydrochlorothiazide. Start Toprol XL 50 mg daily.   *If you need a refill on your cardiac medications before your next appointment, please call your pharmacy*   Lab Work: Your physician recommends that you have labs done in the next few days. Your test included  basic metabolic panel, TSH, liver function and lipids.   If you have labs (blood work) drawn today and your tests are completely normal, you will receive your results only by: [place] (if you have MyChart) OR A paper copy in the mail If you have any lab test that is abnormal or we need to change your treatment, we will call you to review the results.   Testing/Procedures: Your physician has requested that you have an echocardiogram. Echocardiography is a painless test that uses sound waves to create images of your heart. It provides your doctor with information about the size and shape of your heart and how well your heart's chambers and valves are working. This procedure takes approximately one hour. There are no restrictions for this procedure.  We will order CT coronary calcium score. It will cost $99.00 and is not covered by insurance.  Please call [phone] to schedule.   [facility]  [address], [place] [address]   Follow-Up: At [facility], you and your health needs are our priority.  As part of our continuing mission to provide you with exceptional heart care, we have created designated Provider Care Teams.  These Care Teams include your primary Cardiologist (physician) and Advanced Practice Providers (APPs -  Physician Assistants and Nurse Practitioners) who all work together to provide you with the care you need, when you need it.  We recommend signing up for the patient portal called "MyChart".  Sign up information is provided on this After Visit Summary.  MyChart  is used to connect with patients for Virtual Visits (Telemedicine).  Patients are able to view lab/test results, encounter notes, upcoming appointments, etc.  Non-urgent messages can be sent to your provider as well.   To learn more about what you can do with MyChart, go to [email or web address].    Your next appointment:   2 month(s)  The format for your next appointment:   In Person  Provider:   Jyl Heinz, MD   Other Instructions Echocardiogram An echocardiogram is a test that uses sound waves (ultrasound) to produce images of the heart. Images from an echocardiogram can provide important information about: Heart size and shape. The size and thickness and movement of your heart's walls. Heart muscle function and strength. Heart valve function or if you have stenosis. Stenosis is when the heart valves are too narrow. If blood is flowing backward through the heart valves (regurgitation). A tumor or infectious growth around the heart valves. Areas of heart muscle that are not working well because of poor blood flow or injury from a heart attack. Aneurysm detection. An aneurysm is a weak or damaged part of an artery wall. The wall bulges out from the normal force of blood pumping through the body. Tell a health care provider about: Any allergies you have. All medicines you are taking, including vitamins, herbs, eye drops, creams, and over-the-counter medicines. Any blood disorders you have. Any surgeries you have had. Any medical conditions you have. Whether you are pregnant or may be pregnant. What are the risks? Generally, this is a safe  test. However, problems may occur, including an allergic reaction to dye (contrast) that may be used during the test. What happens before the test? No specific preparation is needed. You may eat and drink normally. What happens during the test? You will take off your clothes from the waist up and put on a hospital gown. Electrodes or  electrocardiogram (ECG)patches may be placed on your chest. The electrodes or patches are then connected to a device that monitors your heart rate and rhythm. You will lie down on a table for an ultrasound exam. A gel will be applied to your chest to help sound waves pass through your skin. A handheld device, called a transducer, will be pressed against your chest and moved over your heart. The transducer produces sound waves that travel to your heart and bounce back (or "echo" back) to the transducer. These sound waves will be captured in real-time and changed into images of your heart that can be viewed on a video monitor. The images will be recorded on a computer and reviewed by your health care provider. You may be asked to change positions or hold your breath for a short time. This makes it easier to get different views or better views of your heart. In some cases, you may receive contrast through an IV in one of your veins. This can improve the quality of the pictures from your heart. The procedure may vary among health care providers and hospitals.   What can I expect after the test? You may return to your normal, everyday life, including diet, activities, and medicines, unless your health care provider tells you not to do that. Follow these instructions at home: It is up to you to get the results of your test. Ask your health care provider, or the department that is doing the test, when your results will be ready. Keep all follow-up visits. This is important. Summary An echocardiogram is a test that uses sound waves (ultrasound) to produce images of the heart. Images from an echocardiogram can provide important information about the size and shape of your heart, heart muscle function, heart valve function, and other possible heart problems. You do not need to do anything to prepare before this test. You may eat and drink normally. After the echocardiogram is completed, you may return to your  normal, everyday life, unless your health care provider tells you not to do that. This information is not intended to replace advice given to you by your health care provider. Make sure you discuss any questions you have with your health care provider. Document Revised: 11/16/2019 Document Reviewed: 11/16/2019 Elsevier Patient Education  2021 [place].  Echocardiogram An echocardiogram is a test that uses sound waves (ultrasound) to produce images of the heart. Images from an echocardiogram can provide important information about: Heart size and shape. The size and thickness and movement of your heart's walls. Heart muscle function and strength. Heart valve function or if you have stenosis. Stenosis is when the heart valves are too narrow. If blood is flowing backward through the heart valves (regurgitation). A tumor or infectious growth around the heart valves. Areas of heart muscle that are not working well because of poor blood flow or injury from a heart attack. Aneurysm detection. An aneurysm is a weak or damaged part of an artery wall. The wall bulges out from the normal force of blood pumping through the body. Tell a health care provider about: Any allergies you have. All medicines you are taking,  including vitamins, herbs, eye drops, creams, and over-the-counter medicines. Any blood disorders you have. Any surgeries you have had. Any medical conditions you have. Whether you are pregnant or may be pregnant. What are the risks? Generally, this is a safe test. However, problems may occur, including an allergic reaction to dye (contrast) that may be used during the test. What happens before the test? No specific preparation is needed. You may eat and drink normally. What happens during the test?  You will take off your clothes from the waist up and put on a hospital gown. Electrodes or electrocardiogram (ECG)patches may be placed on your chest. The electrodes or patches are  then connected to a device that monitors your heart rate and rhythm. You will lie down on a table for an ultrasound exam. A gel will be applied to your chest to help sound waves pass through your skin. A handheld device, called a transducer, will be pressed against your chest and moved over your heart. The transducer produces sound waves that travel to your heart and bounce back (or "echo" back) to the transducer. These sound waves will be captured in real-time and changed into images of your heart that can be viewed on a video monitor. The images will be recorded on a computer and reviewed by your health care provider. You may be asked to change positions or hold your breath for a short time. This makes it easier to get different views or better views of your heart. In some cases, you may receive contrast through an IV in one of your veins. This can improve the quality of the pictures from your heart. The procedure may vary among health care providers and hospitals. What can I expect after the test? You may return to your normal, everyday life, including diet, activities, andmedicines, unless your health care provider tells you not to do that. Follow these instructions at home: It is up to you to get the results of your test. Ask your health care provider, or the department that is doing the test, when your results will be ready. Keep all follow-up visits. This is important. Summary An echocardiogram is a test that uses sound waves (ultrasound) to produce images of the heart. Images from an echocardiogram can provide important information about the size and shape of your heart, heart muscle function, heart valve function, and other possible heart problems. You do not need to do anything to prepare before this test. You may eat and drink normally. After the echocardiogram is completed, you may return to your normal, everyday life, unless your health care provider tells you not to do that. This  information is not intended to replace advice given to you by your health care provider. Make sure you discuss any questions you have with your healthcare provider. Document Revised: 11/16/2019 Document Reviewed: 11/16/2019 Elsevier Patient Education  [place]. Blood Pressure Record Sheet To take your blood pressure, you will need a blood pressure machine. You can buy a blood pressure machine (blood pressure monitor) at your clinic, drug store, or online. When choosing one, consider: An automatic monitor that has an arm cuff. A cuff that wraps snugly around your upper arm. You should be able to fit only one finger between your arm and the cuff. A device that stores blood pressure reading results. Do not choose a monitor that measures your blood pressure from your wrist or finger. Follow your health care provider's instructions for how to take your blood pressure. To use  this form: Get one reading in the morning (a.m.) 1-2 hours after you take any medicines. Get one reading in the evening (p.m.) before supper. Take at least 2 readings with each blood pressure check. This makes sure the results are correct. Wait 1-2 minutes between measurements. Write down the results in the spaces on this form. Repeat this once a week, or as told by your health care provider.  Make a follow-up appointment with your health care provider to discuss the results. Blood pressure log Date: _______________________ a.m. _____________________(1st reading) _____________________(2nd reading) p.m. _____________________(1st reading) _____________________(2nd reading) Date: _______________________ a.m. _____________________(1st reading) _____________________(2nd reading) p.m. _____________________(1st reading) _____________________(2nd reading) Date: _______________________ a.m. _____________________(1st reading) _____________________(2nd reading) p.m. _____________________(1st reading) _____________________(2nd  reading) Date: _______________________ a.m. _____________________(1st reading) _____________________(2nd reading) p.m. _____________________(1st reading) _____________________(2nd reading) Date: _______________________ a.m. _____________________(1st reading) _____________________(2nd reading) p.m. _____________________(1st reading) _____________________(2nd reading) This information is not intended to replace advice given to you by your health care provider. Make sure you discuss any questions you have with your health care provider. Document Revised: 07/14/2019 Document Reviewed: 07/14/2019 Elsevier Patient Education  2021 [employer].

## 2020-10-23 NOTE — Progress Notes (Signed)
Cardiology Office Note:    Date:  10/23/2020   ID:  Mercedes Carter, DOB 01-13-1956, MRN [id number]  PCP:  Philmore Pali, NP  Cardiologist:  Jenean Lindau, MD   Referring MD: Philmore Pali, NP    ASSESSMENT:    1. Mixed hyperlipidemia   2. Aortic atherosclerosis ([place])   3. Primary hypertension   4. Morbid obesity ([place])    PLAN:    In order of problems listed above:  Primary prevention stressed with the patient.  Importance of compliance with diet medication stressed and she vocalized understanding. Essential hypertension: Blood pressure stable.  In view of sinus tachycardia change in medication to Toprol-XL 50 mg daily.  We will stop her other blood pressure medication.  She will be back in 1 week for fasting blood work including lipids and at that time she will get a blood pressure log. Palpitations and sinus tachycardia: As mentioned above.  We will also look at her heart rate log which she has a blood pressure log.  Also we will draw TSH to assess this. Morbid obesity: Weight reduction stressed risks of obesity explained and she vocalized understanding. Mixed dyslipidemia: I discussed this with her at extensive length and discussed calcium scoring and she is agreeable. Patient will be seen in follow-up appointment in 2 months or earlier if the patient has any concerns    Medication Adjustments/Labs and Tests Ordered: Current medicines are reviewed at length with the patient today.  Concerns regarding medicines are outlined above.  No orders of the defined types were placed in this encounter.  No orders of the defined types were placed in this encounter.    History of Present Illness:    Mercedes Carter is a 65 y.o. female who is being seen today for the evaluation of sinus tachycardia and palpitations at the request of Lam, Rudi Rummage, NP.  Patient is a pleasant 65 year old female.  She has history of essential hypertension and dyslipidemia.  She is morbidly obese.  She  mentions to me that in the past several months she is lost 40 pounds with intermittent fasting and keto diet.  She denies any chest pain orthopnea or PND.  She tells me that she feels weak at times and that has affected her quality of life.  At the time of my evaluation, the patient is alert awake oriented and in no distress.  Past Medical History:  Diagnosis Date   Acute respiratory failure with hypoxia ([place]) 02/18/2012   Altered mental state 02/18/2012   Anxiety and depression    Arthritis    Asthma    no problems in a year   Complication of anesthesia 02/18/2012   ALOC  DIFFICULT WAKING UP   Condyloma 04/10/2017   DVT (deep venous thrombosis) ([facility])    Left sided   Excessive appetite    Family history of anesthesia complication    mother had difficulty waking   Family history of colon cancer 04/10/2017   Family history of ovarian cancer 04/10/2017   GERD (gastroesophageal reflux disease)    Hepatic steatosis    Hepatomegaly    History of Meniere's disease    Hypercarbia 02/18/2012   Hyperglycemia    Hyperlipemia    Hyperlipidemia    Hypertension    [facility] PCP- had pt. on HCTZ, but took herself off  2 yrs. 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    Past Surgical History:  Procedure Laterality Date   arthroscopic  knee     right knee   CHOLECYSTECTOMY     COLONOSCOPY WITH PROPOFOL N/A 06/05/2017   Procedure: COLONOSCOPY WITH PROPOFOL;  Surgeon: [name], MD;  Location[facility] ENDOSCOPY;  Service: Gastroenterology;  Laterality: N/A;   COLONOSCOPY WITH PROPOFOL N/A 04/21/2018   Procedure: COLONOSCOPY WITH  PROPOFOL;  Surgeon: [name], MD;  Location: [facility] ENDOSCOPY;  Service: Gastroenterology;  Laterality: N/A;   TOTAL KNEE ARTHROPLASTY  02/18/2012   Procedure: TOTAL KNEE ARTHROPLASTY;  Surgeon: Meredith Pel, MD;  Location: [facility];  Service: Orthopedics;  Laterality: Right;  Right total knee arthroplasty    Current Medications: Current Meds  Medication Sig   acetaminophen (TYLENOL) 325 MG tablet Take 325 mg by mouth as needed for mild pain.   Cholecalciferol 1.25 MG (50000 UT) capsule Take 50,000 Units by mouth once a week.   hydrochlorothiazide (HYDRODIURIL) 25 MG tablet Take 25 mg by mouth daily.   levothyroxine (SYNTHROID, LEVOTHROID) 150 MCG tablet Take 150 mcg by mouth daily before breakfast.     Allergies:   Morphine and related, Tetanus toxoids, and Hydrocodone-acetaminophen   Social History   Socioeconomic History   Marital status: Married    Spouse name: Not on file   Number of children: Not on file   Years of education: Not on file   Highest education level: Not on file  Occupational History   Not on file  Tobacco Use   Smoking status: Never   Smokeless tobacco: Never  Vaping Use   Vaping Use: Never used  Substance and Sexual Activity   Alcohol use: Yes    Comment: occ   Drug use: No   Sexual activity: Yes    Birth control/protection: Post-menopausal  Other Topics Concern   Not on file  Social History Narrative   Not on file   Social Determinants of Health   Financial Resource Strain: Not on file  Food Insecurity: Not on file  Transportation Needs: Not on file  Physical Activity: Not on file  Stress: Not on file  Social Connections: Not on file     Family History: The patient's family history includes Asthma in her mother; Breast cancer in her maternal aunt; Colon cancer in her maternal grandmother and another family member; Diabetes in her maternal aunt, maternal grandmother, and mother; Heart disease in her mother; Ovarian cancer in her mother  and sister.  ROS:   Please see the history of present illness.    All other systems reviewed and are negative.  EKGs/Labs/Other Studies Reviewed:    The following studies were reviewed today: EKG reveals sinus tachycardia at a heart rate of 120.   Recent Labs: No results found for requested labs within last 8760 hours.  Recent Lipid Panel No results found for: CHOL, TRIG, HDL, CHOLHDL, VLDL, LDLCALC, LDLDIRECT  Physical Exam:    VS:  BP (!) 148/98   Pulse (!) 120   Ht 5\' 7"  (1.702 m)   Wt 253 lb 6.4 oz (114.9 kg)   SpO2 97%   BMI 39.69 kg/m     Wt Readings from Last 3 Encounters:  10/23/20 253 lb  6.4 oz (114.9 kg)  09/15/20 248 lb (112.5 kg)  12/24/19 265 lb (120.2 kg)     GEN: Patient is in no acute distress HEENT: Normal NECK: No JVD; No carotid bruits LYMPHATICS: No lymphadenopathy CARDIAC: S1 S2 regular, 2/6 systolic murmur at the apex. RESPIRATORY:  Clear to auscultation without rales, wheezing or rhonchi  ABDOMEN: Soft, non-tender, non-distended MUSCULOSKELETAL:  No edema; No deformity  SKIN: Warm and dry NEUROLOGIC:  Alert and oriented x 3 PSYCHIATRIC:  Normal affect    Signed, Jenean Lindau, MD  10/23/2020 4:09 PM    [facility]

## 2020-11-09 ENCOUNTER — Ambulatory Visit (INDEPENDENT_AMBULATORY_CARE_PROVIDER_SITE_OTHER): Payer: Medicare PPO

## 2020-11-09 ENCOUNTER — Other Ambulatory Visit: Payer: Self-pay

## 2020-11-09 DIAGNOSIS — I700 Atherosclerosis of aorta: Secondary | ICD-10-CM

## 2020-11-09 DIAGNOSIS — I10 Essential (primary) hypertension: Secondary | ICD-10-CM

## 2020-11-09 DIAGNOSIS — E782 Mixed hyperlipidemia: Secondary | ICD-10-CM | POA: Diagnosis not present

## 2020-11-09 DIAGNOSIS — E039 Hypothyroidism, unspecified: Secondary | ICD-10-CM | POA: Diagnosis not present

## 2020-11-09 LAB — ECHOCARDIOGRAM COMPLETE
Area-P 1/2: 6.37 cm2
S' Lateral: 3.1 cm

## 2020-11-09 NOTE — Progress Notes (Signed)
Complete echocardiogram performed.  Jimmy Pressley Barsky RDCS, RVT  

## 2020-11-10 ENCOUNTER — Telehealth: Payer: Self-pay

## 2020-11-10 DIAGNOSIS — E782 Mixed hyperlipidemia: Secondary | ICD-10-CM

## 2020-11-10 DIAGNOSIS — I10 Essential (primary) hypertension: Secondary | ICD-10-CM

## 2020-11-10 LAB — HEPATIC FUNCTION PANEL
ALT: 12 IU/L (ref 0–32)
AST: 22 IU/L (ref 0–40)
Albumin: 4.7 g/dL (ref 3.8–4.8)
Alkaline Phosphatase: 78 IU/L (ref 44–121)
Bilirubin Total: 0.4 mg/dL (ref 0.0–1.2)
Bilirubin, Direct: <0.1 mg/dL (ref 0.00–0.40)
Total Protein: 7.1 g/dL (ref 6.0–8.5)

## 2020-11-10 LAB — TSH: TSH: 2.2 u[IU]/mL (ref 0.450–4.500)

## 2020-11-10 LAB — BASIC METABOLIC PANEL
BUN/Creatinine Ratio: 18 (ref 12–28)
BUN: 16 mg/dL (ref 8–27)
CO2: 23 mmol/L (ref 20–29)
Calcium: 9.6 mg/dL (ref 8.7–10.3)
Chloride: 104 mmol/L (ref 96–106)
Creatinine, Ser: 0.91 mg/dL (ref 0.57–1.00)
Glucose: 78 mg/dL (ref 65–99)
Potassium: 4.7 mmol/L (ref 3.5–5.2)
Sodium: 142 mmol/L (ref 134–144)
eGFR: 69 mL/min/{1.73_m2} (ref >59)

## 2020-11-10 LAB — LIPID PANEL
Chol/HDL Ratio: 5.7 ratio — ABNORMAL HIGH (ref 0.0–4.4)
Cholesterol, Total: 270 mg/dL — ABNORMAL HIGH (ref 100–199)
HDL: 47 mg/dL (ref >39)
LDL Chol Calc (NIH): 194 mg/dL — ABNORMAL HIGH (ref 0–99)
Triglycerides: 154 mg/dL — ABNORMAL HIGH (ref 0–149)
VLDL Cholesterol Cal: 29 mg/dL (ref 5–40)

## 2020-11-10 MED ORDER — ROSUVASTATIN CALCIUM 20 MG PO TABS: 20.0000 mg | ORAL_TABLET | Freq: Every day | ORAL | 3 refills | Status: DC

## 2020-11-10 NOTE — Telephone Encounter (Signed)
-----   Message from Jenean Lindau, MD sent at 11/10/2020 12:16 PM EDT ----- Markedly elevated lipids.  Rosuvastatin 20 mg daily, diet, referral to dietitian.  Liver lipid check in 6 weeks.  Copy primary care. Jenean Lindau, MD 11/10/2020 12:16 PM

## 2020-11-10 NOTE — Telephone Encounter (Signed)
-----   Message from Jenean Lindau, MD sent at 11/10/2020 12:17 PM EDT ----- The results of the study is unremarkable. Please inform patient. I will discuss in detail at next appointment. Cc  primary care/referring physician Jenean Lindau, MD 11/10/2020 12:17 PM

## 2020-11-10 NOTE — Telephone Encounter (Signed)
Results sent to PCP. Patient notified of test results and recommendations and agree with plan.

## 2020-11-10 NOTE — Telephone Encounter (Signed)
Spoke with patient regarding results and recommendation.  Patient verbalizes understanding and is agreeable to plan of care. Advised patient to call back with any issues or concerns.  

## 2020-11-14 ENCOUNTER — Institutional Professional Consult (permissible substitution): Payer: Medicare PPO | Admitting: Pulmonary Disease

## 2020-11-15 ENCOUNTER — Ambulatory Visit: Payer: Medicare PPO | Admitting: Gastroenterology

## 2020-11-15 ENCOUNTER — Encounter: Payer: Self-pay | Admitting: *Deleted

## 2020-11-20 ENCOUNTER — Other Ambulatory Visit: Payer: Self-pay

## 2020-11-20 ENCOUNTER — Encounter: Payer: Self-pay | Admitting: Gastroenterology

## 2020-11-20 ENCOUNTER — Ambulatory Visit: Payer: Medicare PPO | Admitting: Gastroenterology

## 2020-11-20 VITALS — BP 121/76 | HR 115 | Temp 98.3°F | Ht 67.0 in | Wt 253.6 lb

## 2020-11-20 DIAGNOSIS — K921 Melena: Secondary | ICD-10-CM

## 2020-11-20 DIAGNOSIS — Z791 Long term (current) use of non-steroidal anti-inflammatories (NSAID): Secondary | ICD-10-CM

## 2020-11-20 DIAGNOSIS — E559 Vitamin D deficiency, unspecified: Secondary | ICD-10-CM | POA: Diagnosis not present

## 2020-11-20 MED ORDER — OMEPRAZOLE 40 MG PO CPDR: 40.0000 mg | DELAYED_RELEASE_CAPSULE | Freq: Every day | ORAL | 3 refills | Status: AC

## 2020-11-20 NOTE — Progress Notes (Signed)
[name] MD, MRCP(U.K) [address]  [place]  [place], [place] [address]  Main: [phone]  Fax: [phone]   Gastroenterology Consultation  Referring Provider:     Brantley Fling Me* Primary Care Physician:  Philmore Pali, NP Primary Gastroenterologist:  Dr. [name]  Reason for Consultation:     Diarrhea   HPI:   Mercedes Carter is a 65 y.o. y/o female referred for consultation & management  by Philmore Pali, NP.    She has previously been seen at my office over 3 years back in June 2019 for diarrhea.  History of adenomatous and villous adenomas that were piecemeal resected.  Back in 2019 was seen for diarrhea with a negative celiac serology was treated for IBS.  Repeat colonoscopy in 2020 showed a 5 mm polyp in the ascending colon otherwise was normal.  It was a serrated polyp.  11/09/2020: TSH normal, hepatic function panel normal, BMP normal no recent CBC on file.  She states that she has noted a change in the color of her stool.  Sometimes it is green color sometimes is black in color she has a bit of abdominal discomfort over the upper part of her abdomen worse when she eats going on for many months that she has been taking Motrin 2-3 times a week for over 2 years.  History of chronic nausea.  She is on a keto diet and has lost a lot of weight intentionally.  Not taking any PPI.  Past Medical History:  Diagnosis Date   Acute respiratory failure with hypoxia ([place]) 02/18/2012   Altered mental state 02/18/2012   Anxiety and depression    Arthritis    Asthma    no problems in a year   Complication of anesthesia 02/18/2012   ALOC  DIFFICULT WAKING UP   Condyloma 04/10/2017   DVT (deep venous thrombosis) (HCC)    Left sided   Excessive appetite    Family history of anesthesia complication    mother had difficulty waking   Family history of colon cancer 04/10/2017   Family history of ovarian cancer 04/10/2017   GERD (gastroesophageal reflux disease)     Hepatic steatosis    Hepatomegaly    History of Meniere's disease    Hypercarbia 02/18/2012   Hyperglycemia    Hyperlipemia    Hyperlipidemia    Hypertension    [facility] PCP- had pt. on HCTZ, but took herself off  2 yrs. 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    Past Surgical History:  Procedure Laterality Date   arthroscopic  knee     right knee   CHOLECYSTECTOMY     COLONOSCOPY WITH PROPOFOL N/A 06/05/2017   Procedure: COLONOSCOPY WITH PROPOFOL;  Surgeon: [name], MD;  Location[facility] ENDOSCOPY;  Service: Gastroenterology;  Laterality: N/A;   COLONOSCOPY WITH PROPOFOL N/A 04/21/2018   Procedure: COLONOSCOPY WITH PROPOFOL;  Surgeon: [name], MD;  Location: [facility] ENDOSCOPY;  Service: Gastroenterology;  Laterality: N/A;   TOTAL KNEE ARTHROPLASTY  02/18/2012   Procedure: TOTAL KNEE ARTHROPLASTY;  Surgeon: Meredith Pel, MD;  Location: [facility];  Service: Orthopedics;  Laterality: Right;  Right total knee arthroplasty    Prior to Admission medications   Medication Sig Start Date End Date Taking? Authorizing Provider  acetaminophen (TYLENOL) 325 MG tablet Take 325 mg by mouth as needed for mild pain.   Yes [provider]  Cholecalciferol 1.25 MG (50000 UT) capsule Take 50,000 Units by mouth once a week.   Yes [provider]  hydrochlorothiazide (HYDRODIURIL) 25 MG tablet Take 25 mg by mouth daily. 05/13/17  Yes [provider]  levothyroxine (SYNTHROID, LEVOTHROID) 150 MCG tablet Take 150 mcg by mouth daily before breakfast. 02/04/17  Yes [provider]  metoprolol succinate  (TOPROL XL) 50 MG 24 hr tablet Take 1 tablet (50 mg total) by mouth daily. 10/23/20  Yes Revankar, Reita Cliche, MD  rosuvastatin (CRESTOR) 20 MG tablet Take 1 tablet (20 mg total) by mouth daily. 11/10/20 02/08/21 Yes Revankar, Reita Cliche, MD    Family History  Problem Relation Age of Onset   Ovarian cancer Mother    Diabetes Mother    Asthma Mother    Heart disease Mother    Ovarian cancer Sister    Colon cancer Maternal Grandmother    Diabetes Maternal Grandmother    Diabetes Maternal Aunt    Breast cancer Maternal Aunt    Colon cancer Other      Social History   Tobacco Use   Smoking status: Never   Smokeless tobacco: Never  Vaping Use   Vaping Use: Never used  Substance Use Topics   Alcohol use: Yes    Comment: occ   Drug use: No    Allergies as of 11/20/2020 - Review Complete 11/20/2020  Allergen Reaction Noted   Morphine and related Shortness Of Breath 04/10/2017   Tetanus toxoids Anaphylaxis    Hydrocodone-acetaminophen Itching 07/21/2014    Review of Systems:    All systems reviewed and negative except where noted in HPI.   Physical Exam:  BP 121/76   Pulse (!) 115   Temp 98.3 F (36.8 C) (Oral)   Ht '5\' 7"'$  (1.702 m)   Wt 253 lb 9.6 oz (115 kg)   BMI 39.72 kg/m  No LMP recorded. Patient is postmenopausal. Psych:  Alert and cooperative. Normal mood and affect. General:   Alert,  Well-developed, well-nourished, pleasant and cooperative in NAD Head:  Normocephalic and atraumatic. Eyes:  Sclera clear, no icterus.   Conjunctiva pink. Ears:  Normal auditory acuity. Nose:  No deformity, discharge, or lesions. Mouth:  No deformity or lesions,oropharynx pink & moist. Neck:  Supple; no masses or thyromegaly. Lungs:  Respirations even and unlabored.  Clear throughout to auscultation.   No wheezes, crackles, or rhonchi. No acute distress. Heart:  Regular rate and rhythm; no murmurs, clicks, rubs, or gallops. Abdomen:  Normal bowel sounds.  No bruits.  Soft, non-tender and  non-distended without masses, hepatosplenomegaly or hernias noted.  No guarding or rebound tenderness.    Msk:  Symmetrical without gross deformities. Good, equal movement & strength bilaterally. Pulses:  Normal pulses noted. Extremities:  No clubbing or edema.  No cyanosis. Neurologic:  Alert and oriented x3;  grossly normal neurologically. Skin:  Intact without significant lesions or rashes. No jaundice. Lymph Nodes:  No significant cervical adenopathy. Psych:  Alert and cooperative. Normal mood and affect.  Imaging Studies: ECHOCARDIOGRAM COMPLETE  Result Date: 11/09/2020    ECHOCARDIOGRAM REPORT   Patient Name:   Mercedes Carter Date of Exam: 11/09/2020 Medical  Rec #:  [id number]        Height:       67.0 in Accession #:    [id number]       Weight:       253.4 lb Date of Birth:  [birth year]        BSA:          2.236 m Patient Age:    86 years         BP:           148/98 mmHg Patient Gender: F                HR:           73 bpm. Exam Location:  [place] Procedure: 2D Echo Indications:    Aortic atherosclerosis (HCC) [I70.0 (ICD-10-CM)]; Primary                 hypertension [I10 (ICD-10-CM)]  History:        Patient has no prior history of Echocardiogram examinations.                 Risk Factors:Dyslipidemia.  Sonographer:    Luane School Referring Phys: [place]  1. Left ventricular ejection fraction, by estimation, is 55 to 60%. The left ventricle has normal function. The left ventricle has no regional wall motion abnormalities. There is mild concentric left ventricular hypertrophy. Left ventricular diastolic parameters are consistent with Grade I diastolic dysfunction (impaired relaxation).  2. Right ventricular systolic function is normal. The right ventricular size is normal. There is normal pulmonary artery systolic pressure.  3. The mitral valve is normal in structure. No evidence of mitral valve regurgitation. No evidence of mitral stenosis.  4. The aortic valve is  tricuspid. Aortic valve regurgitation is not visualized. No aortic stenosis is present.  5. The inferior vena cava is normal in size with greater than 50% respiratory variability, suggesting right atrial pressure of 3 mmHg. FINDINGS  Left Ventricle: Left ventricular ejection fraction, by estimation, is 55 to 60%. The left ventricle has normal function. The left ventricle has no regional wall motion abnormalities. Global longitudinal strain performed but not reported based on interpreter judgement due to suboptimal tracking. The left ventricular internal cavity size was normal in size. There is mild concentric left ventricular hypertrophy. Left ventricular diastolic parameters are consistent with Grade I diastolic dysfunction  (impaired relaxation). Normal left ventricular filling pressure. Right Ventricle: The right ventricular size is normal. No increase in right ventricular wall thickness. Right ventricular systolic function is normal. There is normal pulmonary artery systolic pressure. The tricuspid regurgitant velocity is 1.98 m/s, and  with an assumed right atrial pressure of 3 mmHg, the estimated right ventricular systolic pressure is [id number]. Left Atrium: Left atrial size was normal in size. Right Atrium: Right atrial size was normal in size. Pericardium: There is no evidence of pericardial effusion. Mitral Valve: The mitral valve is normal in structure. No evidence of mitral valve regurgitation. No evidence of mitral valve stenosis. Tricuspid Valve: The tricuspid valve is normal in structure. Tricuspid valve regurgitation is trivial. No evidence of tricuspid stenosis. Aortic Valve: The aortic valve is tricuspid. Aortic valve regurgitation is not visualized. No aortic stenosis is present. Pulmonic Valve: The pulmonic valve was normal in structure. Pulmonic valve regurgitation is not visualized. No evidence of pulmonic stenosis. Aorta: The aortic root and ascending aorta are structurally normal, with no  evidence of dilitation and the aortic arch was not  well visualized. Venous: The pulmonary veins were not well visualized. The inferior vena cava is normal in size with greater than 50% respiratory variability, suggesting right atrial pressure of 3 mmHg. IAS/Shunts: No atrial level shunt detected by color flow Doppler.  LEFT VENTRICLE PLAX 2D LVIDd:         4.20 cm  Diastology LVIDs:         3.10 cm  LV e' medial:    4.35 cm/s LV PW:         1.10 cm  LV E/e' medial:  11.6 LV IVS:        1.10 cm  LV e' lateral:   6.53 cm/s LVOT diam:     1.90 cm  LV E/e' lateral: 7.7 LV SV:         52 LV SV Index:   23 LVOT Area:     2.84 cm  RIGHT VENTRICLE             IVC RV S prime:     10.80 cm/s  IVC diam: 1.70 cm TAPSE (M-mode): 1.8 cm LEFT ATRIUM             Index       RIGHT ATRIUM          Index LA diam:        3.60 cm 1.61 cm/m  RA Area:     9.56 cm LA Vol (A2C):   26.4 ml 11.81 ml/m RA Volume:   16.40 ml 7.33 ml/m LA Vol (A4C):   31.1 ml 13.91 ml/m LA Biplane Vol: 28.8 ml 12.88 ml/m  AORTIC VALVE LVOT Vmax:   79.60 cm/s LVOT Vmean:  56.300 cm/s LVOT VTI:    0.183 m  AORTA Ao Root diam: 3.20 cm Ao Asc diam:  3.30 cm Ao Desc diam: 2.10 cm MITRAL VALVE               TRICUSPID VALVE MV Area (PHT): 6.37 cm    TR Peak grad:   15.7 mmHg MV Decel Time: 119 msec    TR Vmax:        198.00 cm/s MV E velocity: 50.60 cm/s MV A velocity: 90.40 cm/s  SHUNTS MV E/A ratio:  0.56        Systemic VTI:  0.18 m                            Systemic Diam: 1.90 cm Shirlee More MD Electronically signed by Shirlee More MD Signature Date/Time: 11/09/2020/5:34:57 PM    Final     Assessment and Plan:   LOVINIA WILTSE is a 65 y.o. y/o female here to see me for change in the color of her stool.  History suggestive of melena likely secondary to long-term NSAID use.  I have suggested her to stop using NSAIDs.  Commence on Prilosec 40 mg daily to heal any gastritis/ulcer.  I will obtain a CBC today and schedule her for an upper endoscopy.   I  have discussed alternative options, risks & benefits,  which include, but are not limited to, bleeding, infection, perforation,respiratory complication & drug reaction.  The patient agrees with this plan & written consent will be obtained.     Follow up in 8 to 12-week  Dr [name] MD,MRCP(U.K)

## 2020-11-21 LAB — CBC
Hematocrit: 39.4 % (ref 34.0–46.6)
Hemoglobin: 13.2 g/dL (ref 11.1–15.9)
MCH: 28.8 pg (ref 26.6–33.0)
MCHC: 33.5 g/dL (ref 31.5–35.7)
MCV: 86 fL (ref 79–97)
Platelets: 318 10*3/uL (ref 150–450)
RBC: 4.58 x10E6/uL (ref 3.77–5.28)
RDW: 13.1 % (ref 11.7–15.4)
WBC: 5.8 10*3/uL (ref 3.4–10.8)

## 2020-11-21 LAB — H. PYLORI BREATH TEST: H pylori Breath Test: NEGATIVE

## 2020-11-22 ENCOUNTER — Telehealth: Payer: Self-pay

## 2020-11-22 NOTE — Telephone Encounter (Signed)
Per Tressia Miners and Preservice via secure chat: [Yesterday 5:15 PM] Faythe Dingwall can you look at this one too? MRN: [id number] I saw the Josem Kaufmann is on health help and it says awaiting for scheduling -- Also i just got off the phone with the patient and shes concerned because she was told by the doctor that the procedure was going to be done on friday and its scheduled for thursday the 25th. I told her i would let you guys know and if there was a problem or there was a scheduling error that the office would call her back to let her know.   Called patient and she did not answer. I will try to call her later on.

## 2020-11-22 NOTE — Telephone Encounter (Signed)
Called and spoke to patient's husband- Mr. [place] since she was on a dental chair at the moment I called. However, I asked if she was having any confusion with her EGD date and he stated that they didn't and wasn't sure why I was even calling since they were good with date and instructions. I then told them that pre-service had messaged [place] the clinic to get in contact with the patient. Mr. Lightbourn stated that they were good and had no questions.

## 2020-11-30 ENCOUNTER — Encounter: Payer: Self-pay | Admitting: Gastroenterology

## 2020-11-30 ENCOUNTER — Ambulatory Visit: Payer: Medicare PPO | Admitting: Certified Registered Nurse Anesthetist

## 2020-11-30 ENCOUNTER — Ambulatory Visit
Admission: RE | Admit: 2020-11-30 | Discharge: 2020-11-30 | Disposition: A | Discharge status: Home / Self Care | Payer: Medicare PPO | Attending: Gastroenterology | Admitting: Gastroenterology

## 2020-11-30 ENCOUNTER — Encounter
Admission: RE | Disposition: A | Discharge status: Home / Self Care | Payer: Self-pay | Source: Home / Self Care | Attending: Gastroenterology

## 2020-11-30 DIAGNOSIS — Z79899 Other long term (current) drug therapy: Secondary | ICD-10-CM | POA: Insufficient documentation

## 2020-11-30 DIAGNOSIS — Z800 Family history of malignant neoplasm of digestive organs: Secondary | ICD-10-CM | POA: Insufficient documentation

## 2020-11-30 DIAGNOSIS — Z803 Family history of malignant neoplasm of breast: Secondary | ICD-10-CM | POA: Insufficient documentation

## 2020-11-30 DIAGNOSIS — Z6837 Body mass index (BMI) 37.0-37.9, adult: Secondary | ICD-10-CM | POA: Insufficient documentation

## 2020-11-30 DIAGNOSIS — Z833 Family history of diabetes mellitus: Secondary | ICD-10-CM | POA: Diagnosis not present

## 2020-11-30 DIAGNOSIS — E785 Hyperlipidemia, unspecified: Secondary | ICD-10-CM | POA: Diagnosis not present

## 2020-11-30 DIAGNOSIS — Z836 Family history of other diseases of the respiratory system: Secondary | ICD-10-CM | POA: Diagnosis not present

## 2020-11-30 DIAGNOSIS — Z8041 Family history of malignant neoplasm of ovary: Secondary | ICD-10-CM | POA: Diagnosis not present

## 2020-11-30 DIAGNOSIS — E669 Obesity, unspecified: Secondary | ICD-10-CM | POA: Diagnosis not present

## 2020-11-30 DIAGNOSIS — Z8249 Family history of ischemic heart disease and other diseases of the circulatory system: Secondary | ICD-10-CM | POA: Diagnosis not present

## 2020-11-30 DIAGNOSIS — K921 Melena: Secondary | ICD-10-CM | POA: Diagnosis not present

## 2020-11-30 DIAGNOSIS — Z86718 Personal history of other venous thrombosis and embolism: Secondary | ICD-10-CM | POA: Insufficient documentation

## 2020-11-30 HISTORY — PX: ESOPHAGOGASTRODUODENOSCOPY (EGD) WITH PROPOFOL: SHX5813

## 2020-11-30 SURGERY — ESOPHAGOGASTRODUODENOSCOPY (EGD) WITH PROPOFOL
Anesthesia: General

## 2020-11-30 MED ORDER — LIDOCAINE HCL (CARDIAC) PF 100 MG/5ML IV SOSY
PREFILLED_SYRINGE | INTRAVENOUS | Status: DC | PRN
  Administered 2020-11-30: 50 mg via INTRAVENOUS

## 2020-11-30 MED ORDER — MIDAZOLAM HCL 2 MG/2ML IJ SOLN
INTRAMUSCULAR | Status: DC | PRN
  Administered 2020-11-30: 2 mg via INTRAVENOUS

## 2020-11-30 MED ORDER — PROPOFOL 10 MG/ML IV BOLUS
INTRAVENOUS | Status: DC | PRN
  Administered 2020-11-30: 20 mg via INTRAVENOUS
  Administered 2020-11-30: 100 mg via INTRAVENOUS

## 2020-11-30 MED ORDER — GLYCOPYRROLATE 0.2 MG/ML IJ SOLN
INTRAMUSCULAR | Status: AC
  Filled 2020-11-30: qty 1

## 2020-11-30 MED ORDER — SODIUM CHLORIDE 0.9 % IV SOLN
INTRAVENOUS | Status: DC
  Administered 2020-11-30: 1000 mL via INTRAVENOUS

## 2020-11-30 MED ORDER — MIDAZOLAM HCL 2 MG/2ML IJ SOLN
INTRAMUSCULAR | Status: AC
  Filled 2020-11-30: qty 2

## 2020-11-30 MED ORDER — PROPOFOL 10 MG/ML IV BOLUS
INTRAVENOUS | Status: AC
  Filled 2020-11-30: qty 20

## 2020-11-30 MED ORDER — PHENYLEPHRINE HCL (PRESSORS) 10 MG/ML IV SOLN
INTRAVENOUS | Status: AC
  Filled 2020-11-30: qty 1

## 2020-11-30 MED ORDER — LIDOCAINE HCL (PF) 2 % IJ SOLN
INTRAMUSCULAR | Status: AC
  Filled 2020-11-30: qty 5

## 2020-11-30 NOTE — Anesthesia Postprocedure Evaluation (Signed)
Anesthesia Post Note  Patient: KD BORUCH  Procedure(s) Performed: ESOPHAGOGASTRODUODENOSCOPY (EGD) WITH PROPOFOL  Patient location during evaluation: Endoscopy Anesthesia Type: General Level of consciousness: awake and alert Pain management: pain level controlled Vital Signs Assessment: post-procedure vital signs reviewed and stable Respiratory status: spontaneous breathing, nonlabored ventilation, respiratory function stable and patient connected to nasal cannula oxygen Cardiovascular status: blood pressure returned to baseline and stable Postop Assessment: no apparent nausea or vomiting Anesthetic complications: no   No notable events documented.   Last Vitals:  Vitals:   11/30/20 0815 11/30/20 0825  BP: 126/77 134/77  Pulse: 71 70  Resp: 18 17  Temp:    SpO2: 97% 100%    Last Pain:  Vitals:   11/30/20 0825  TempSrc:   PainSc: 0-No pain                 Martha Clan

## 2020-11-30 NOTE — Transfer of Care (Signed)
Immediate Anesthesia Transfer of Care Note  Patient: KAYDIENCE CAUDLE  Procedure(s) Performed: ESOPHAGOGASTRODUODENOSCOPY (EGD) WITH PROPOFOL  Patient Location: PACU  Anesthesia Type:General  Level of Consciousness: drowsy  Airway & Oxygen Therapy: Patient Spontanous Breathing and Patient connected to face mask oxygen  Post-op Assessment: Report given to RN and Post -op Vital signs reviewed and stable  Post vital signs: Reviewed and stable  Last Vitals:  Vitals Value Taken Time  BP 117/66 11/30/20 0755  Temp 35.8 C 11/30/20 0755  Pulse 75 11/30/20 0755  Resp 20 11/30/20 0755  SpO2 100 % 11/30/20 0755    Last Pain:  Vitals:   11/30/20 0755  TempSrc: Tympanic  PainSc: Asleep         Complications: No notable events documented.

## 2020-11-30 NOTE — Anesthesia Preprocedure Evaluation (Signed)
Anesthesia Evaluation  Patient identified by MRN, date of birth, ID band Patient awake    Reviewed: Allergy & Precautions, H&P , NPO status , Patient's Chart, lab work & pertinent test results, reviewed documented beta blocker date and time   History of Anesthesia Complications (+) Family history of anesthesia reaction and history of anesthetic complications  Airway Mallampati: II   Neck ROM: full    Dental  (+) Poor Dentition, Dental Advidsory Given   Pulmonary neg pulmonary ROS, neg shortness of breath, asthma , sleep apnea and Continuous Positive Airway Pressure Ventilation , neg recent URI,    Pulmonary exam normal        Cardiovascular Exercise Tolerance: Poor hypertension, On Medications (-) angina(-) Past MI and (-) Cardiac Stents negative cardio ROS Normal cardiovascular exam(-) dysrhythmias (-) Valvular Problems/Murmurs Rhythm:regular Rate:Normal     Neuro/Psych PSYCHIATRIC DISORDERS Anxiety Depression negative neurological ROS     GI/Hepatic negative GI ROS, Neg liver ROS, GERD  Medicated,  Endo/Other  neg diabetesHypothyroidism   Renal/GU negative Renal ROS  negative genitourinary   Musculoskeletal   Abdominal   Peds  Hematology negative hematology ROS (+)   Anesthesia Other Findings Past Medical History: No date: Arthritis No date: Asthma     Comment:  no problems in a year [id number]: Complication of anesthesia     Comment:  ALOC  DIFFICULT WAKING UP No date: Family history of anesthesia complication     Comment:  mother had difficulty waking No date: GERD (gastroesophageal reflux disease) No date: Hepatic steatosis No date: Hepatomegaly No date: Hyperlipemia No date: Hypertension     Comment:  [facility] PCP- had pt. on HCTZ, but took herself off  2 yrs.               ago No date: Hypothyroidism No date: Incontinence of urine No date: Sleep apnea     Comment:  has CPAP but does not use Past  Surgical History: No date: arthroscopic  knee     Comment:  right knee No date: CHOLECYSTECTOMY 06/05/2017: COLONOSCOPY WITH PROPOFOL; N/A     Comment:  Procedure: COLONOSCOPY WITH PROPOFOL;  Surgeon: [name], MD;  Location: [facility] ENDOSCOPY;  Service:               Gastroenterology;  Laterality: N/A; 02/18/2012: TOTAL KNEE ARTHROPLASTY 02/18/2012: TOTAL KNEE ARTHROPLASTY     Comment:  Procedure: TOTAL KNEE ARTHROPLASTY;  Surgeon: Meredith Pel, MD;  Location: [place];  Service: Orthopedics;               Laterality: Right;  Right total knee arthroplasty   Reproductive/Obstetrics negative OB ROS                             Anesthesia Physical  Anesthesia Plan  ASA: 3  Anesthesia Plan: General   Post-op Pain Management:    Induction: Intravenous  PONV Risk Score and Plan: 3 and TIVA and Propofol infusion  Airway Management Planned: Natural Airway and Nasal Cannula  Additional Equipment:   Intra-op Plan:   Post-operative Plan:   Informed Consent: I have reviewed the patients History and Physical, chart, labs and discussed the procedure including the risks, benefits and alternatives for the proposed anesthesia with the patient or authorized representative who has indicated  his/her understanding and acceptance.     Dental Advisory Given  Plan Discussed with: CRNA  Anesthesia Plan Comments:         Anesthesia Quick Evaluation

## 2020-11-30 NOTE — Op Note (Signed)
[facility] Gastroenterology Patient Name: Mercedes Carter Procedure Date: 11/30/2020 7:21 AM MRN: [id number] Account #: [id number] Date of Birth: 1955-07-11 Admit Type: Outpatient Age: 65 Room: [facility] ENDO ROOM 3 Gender: Female Note Status: Finalized Procedure:             Upper GI endoscopy Indications:           Melena Providers:             [name] MD, MD Referring MD:          Harlene Ramus (Referring MD) Medicines:             Monitored Anesthesia Care Complications:         No immediate complications. Procedure:             Pre-Anesthesia Assessment:                        - Prior to the procedure, a History and Physical was                         performed, and patient medications, allergies and                         sensitivities were reviewed. The patient's tolerance                         of previous anesthesia was reviewed.                        - The risks and benefits of the procedure and the                         sedation options and risks were discussed with the                         patient. All questions were answered and informed                         consent was obtained.                        - ASA Grade Assessment: II - A patient with mild                         systemic disease.                        After obtaining informed consent, the endoscope was                         passed under direct vision. Throughout the procedure,                         the patient's blood pressure, pulse, and oxygen                         saturations were monitored continuously. The Endoscope                         was introduced through the mouth, and advanced to the  fourth part of duodenum. The upper GI endoscopy was                         accomplished with ease. The patient tolerated the                         procedure well. Findings:      The esophagus was normal.      The stomach was normal.      The examined  duodenum was normal. Impression:            - Normal esophagus.                        - Normal stomach.                        - Normal examined duodenum.                        - No specimens collected. Recommendation:        - Discharge patient to home (with escort).                        - Resume previous diet.                        - Continue present medications.                        - Avoid long term nsaid use Procedure Code(s):     --- Professional ---                        [phone], Esophagogastroduodenoscopy, flexible,                         transoral; diagnostic, including collection of                         specimen(s) by brushing or washing, when performed                         (separate procedure) Diagnosis Code(s):     --- Professional ---                        K92.1, Melena (includes Hematochezia) CPT copyright 2019 American Medical Association. All rights reserved. The codes documented in this report are preliminary and upon coder review may  be revised to meet current compliance requirements. [name], MD [name] MD, MD 11/30/2020 7:48:47 AM This report has been signed electronically. Number of Addenda: 0 Note Initiated On: 11/30/2020 7:21 AM Estimated Blood Loss:  Estimated blood loss: none.      [facility]

## 2020-11-30 NOTE — H&P (Signed)
[name], MD [address] [place], [place], [place], [address] [id number], [place], [place], [place], [address] Phone: [phone]  Fax: [phone]  Primary Care Physician:  Philmore Pali, NP   Pre-Procedure History & Physical: HPI:  Mercedes Carter is a 65 y.o. female is here for an endoscopy    Past Medical History:  Diagnosis Date   Acute respiratory failure with hypoxia ([place]) 02/18/2012   Altered mental state 02/18/2012   Anxiety and depression    Arthritis    Asthma    no problems in a year   Complication of anesthesia 02/18/2012   ALOC  DIFFICULT WAKING UP   Condyloma 04/10/2017   DVT (deep venous thrombosis) (HCC)    Left sided   Excessive appetite    Family history of anesthesia complication    mother had difficulty waking   Family history of colon cancer 04/10/2017   Family history of ovarian cancer 04/10/2017   GERD (gastroesophageal reflux disease)    Hepatic steatosis    Hepatomegaly    History of Meniere's disease    Hypercarbia 02/18/2012   Hyperglycemia    Hyperlipemia    Hyperlipidemia    Hypertension    [facility] PCP- had pt. on HCTZ, but took herself off  2 yrs. 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    Past Surgical History:  Procedure Laterality Date   arthroscopic  knee     right knee   CHOLECYSTECTOMY     COLONOSCOPY WITH PROPOFOL N/A 06/05/2017   Procedure: COLONOSCOPY WITH PROPOFOL;  Surgeon: [name], MD;  Location[facility] END[facility]COPY;  Service: Gastroenterology;  Laterality: N/A;   COLONOSCOPY WITH PROPOFOL N/A 04/21/2018   Procedure: COLONOSCOPY WITH PROPOFOL;   Surgeon: [name], MD;  Location: [facility] ENDOSCOPY;  Service: Gastroenterology;  Laterality: N/A;   JOINT REPLACEMENT     TOTAL KNEE ARTHROPLASTY  02/18/2012   Procedure: TOTAL KNEE ARTHROPLASTY;  Surgeon: Meredith Pel, MD;  Location: [facility];  Service: Orthopedics;  Laterality: Right;  Right total knee arthroplasty    Prior to Admission medications   Medication Sig Start Date End Date Taking? Authorizing Provider  acetaminophen (TYLENOL) 325 MG tablet Take 325 mg by mouth as needed for mild pain.   Yes [provider]  hydrochlorothiazide (HYDRODIURIL) 25 MG tablet Take 25 mg by mouth daily. 05/13/17  Yes [provider]  levothyroxine (SYNTHROID, LEVOTHROID) 150 MCG tablet Take 150 mcg by mouth daily before breakfast. 02/04/17  Yes [provider]  metoprolol succinate (TOPROL XL) 50 MG 24 hr tablet Take 1 tablet (50 mg total) by mouth daily. 10/23/20  Yes Revankar, Reita Cliche, MD  omeprazole (PRILOSEC) 40 MG capsule Take 1 capsule (40 mg total) by mouth daily. 11/20/20  Yes [name], MD  rosuvastatin (CRESTOR) 20 MG tablet Take 1 tablet (20 mg total) by mouth daily. 11/10/20 02/08/21 Yes Revankar, Reita Cliche, MD  Cholecalciferol 1.25 MG (50000 UT) capsule Take 50,000 Units by mouth once a week. Patient not taking: Reported on 11/30/2020    [provider]  Allergies as of 11/20/2020 - Review Complete 11/20/2020  Allergen Reaction Noted   Morphine and related Shortness Of Breath 04/10/2017   Tetanus toxoids Anaphylaxis    Hydrocodone-acetaminophen Itching 07/21/2014    Family History  Problem Relation Age of Onset   Ovarian cancer Mother    Diabetes Mother    Asthma Mother    Heart disease Mother    Ovarian cancer Sister    Colon cancer Maternal Grandmother    Diabetes Maternal Grandmother    Diabetes Maternal Aunt    Breast cancer Maternal Aunt    Colon cancer Other     Social History   Socioeconomic History   Marital status: Married     Spouse name: Not on file   Number of children: Not on file   Years of education: Not on file   Highest education level: Not on file  Occupational History   Not on file  Tobacco Use   Smoking status: Never   Smokeless tobacco: Never  Vaping Use   Vaping Use: Never used  Substance and Sexual Activity   Alcohol use: Not Currently    Comment: occ   Drug use: No   Sexual activity: Yes    Birth control/protection: Post-menopausal  Other Topics Concern   Not on file  Social History Narrative   Not on file   Social Determinants of Health   Financial Resource Strain: Not on file  Food Insecurity: Not on file  Transportation Needs: Not on file  Physical Activity: Not on file  Stress: Not on file  Social Connections: Not on file  Intimate Partner Violence: Not on file    Review of Systems: See HPI, otherwise negative ROS  Physical Exam: BP (!) 138/92   Pulse 80   Temp (!) 97.5 F (36.4 C) (Temporal)   Resp 18   Ht '5\' 7"'$  (1.702 m)   Wt 109.3 kg   SpO2 100%   BMI 37.75 kg/m  General:   Alert,  pleasant and cooperative in NAD Head:  Normocephalic and atraumatic. Neck:  Supple; no masses or thyromegaly. Lungs:  Clear throughout to auscultation, normal respiratory effort.    Heart:  +S1, +S2, Regular rate and rhythm, No edema. Abdomen:  Soft, nontender and nondistended. Normal bowel sounds, without guarding, and without rebound.   Neurologic:  Alert and  oriented x4;  grossly normal neurologically.  Impression/Plan: Mercedes Carter is here for an endoscopy  to be performed for  evaluation of melena    Risks, benefits, limitations, and alternatives regarding endoscopy have been reviewed with the patient.  Questions have been answered.  All parties agreeable.   [name], MD  11/30/2020, 7:39 AM melena

## 2020-12-01 ENCOUNTER — Encounter: Payer: Self-pay | Admitting: Gastroenterology

## 2020-12-15 DIAGNOSIS — I10 Essential (primary) hypertension: Secondary | ICD-10-CM | POA: Diagnosis not present

## 2020-12-15 DIAGNOSIS — F419 Anxiety disorder, unspecified: Secondary | ICD-10-CM | POA: Diagnosis not present

## 2020-12-15 DIAGNOSIS — R000 Tachycardia, unspecified: Secondary | ICD-10-CM | POA: Diagnosis not present

## 2020-12-15 DIAGNOSIS — G4700 Insomnia, unspecified: Secondary | ICD-10-CM | POA: Diagnosis not present

## 2020-12-15 DIAGNOSIS — R0602 Shortness of breath: Secondary | ICD-10-CM | POA: Diagnosis not present

## 2020-12-15 DIAGNOSIS — G4733 Obstructive sleep apnea (adult) (pediatric): Secondary | ICD-10-CM | POA: Diagnosis not present

## 2020-12-15 DIAGNOSIS — E039 Hypothyroidism, unspecified: Secondary | ICD-10-CM | POA: Diagnosis not present

## 2020-12-15 DIAGNOSIS — R5383 Other fatigue: Secondary | ICD-10-CM | POA: Diagnosis not present

## 2020-12-15 DIAGNOSIS — M170 Bilateral primary osteoarthritis of knee: Secondary | ICD-10-CM | POA: Diagnosis not present

## 2020-12-15 DIAGNOSIS — H8109 Meniere's disease, unspecified ear: Secondary | ICD-10-CM | POA: Diagnosis not present

## 2020-12-25 ENCOUNTER — Other Ambulatory Visit: Payer: Self-pay

## 2020-12-26 ENCOUNTER — Encounter: Payer: Self-pay | Admitting: Cardiology

## 2020-12-26 ENCOUNTER — Other Ambulatory Visit: Payer: Self-pay

## 2020-12-26 ENCOUNTER — Ambulatory Visit: Payer: Medicare PPO | Admitting: Cardiology

## 2020-12-26 VITALS — BP 112/74 | HR 108 | Ht 67.0 in | Wt 248.6 lb

## 2020-12-26 DIAGNOSIS — E669 Obesity, unspecified: Secondary | ICD-10-CM

## 2020-12-26 DIAGNOSIS — I10 Essential (primary) hypertension: Secondary | ICD-10-CM | POA: Diagnosis not present

## 2020-12-26 DIAGNOSIS — I700 Atherosclerosis of aorta: Secondary | ICD-10-CM | POA: Diagnosis not present

## 2020-12-26 DIAGNOSIS — E782 Mixed hyperlipidemia: Secondary | ICD-10-CM

## 2020-12-26 NOTE — Patient Instructions (Signed)
Medication Instructions:  No medication changes. *If you need a refill on your cardiac medications before your next appointment, please call your pharmacy*   Lab Work: Your physician recommends that you return for lab work in: the next few days. You need to have labs done when you are fasting.  You can come Monday through Friday 8:30 am to 12:00 pm and 1:15 to 4:30. You do not need to make an appointment as the order has already been placed. The labs you are going to have done are LFT and Lipids.  If you have labs (blood work) drawn today and your tests are completely normal, you will receive your results only by: [place] (if you have MyChart) OR A paper copy in the mail If you have any lab test that is abnormal or we need to change your treatment, we will call you to review the results.   Testing/Procedures:  We will order CT coronary calcium score. It will cost $99.00 and is not covered by insurance.  Please call [phone] to schedule.   [facility]  [address], [place] [address]    Follow-Up: At [facility], you and your health needs are our priority.  As part of our continuing mission to provide you with exceptional heart care, we have created designated Provider Care Teams.  These Care Teams include your primary Cardiologist (physician) and Advanced Practice Providers (APPs -  Physician Assistants and Nurse Practitioners) who all work together to provide you with the care you need, when you need it.  We recommend signing up for the patient portal called "MyChart".  Sign up information is provided on this After Visit Summary.  MyChart is used to connect with patients for Virtual Visits (Telemedicine).  Patients are able to view lab/test results, encounter notes, upcoming appointments, etc.  Non-urgent messages can be sent to your provider as well.   To learn more about what you can do with MyChart, go to [email or web address].    Your next  appointment:   6 month(s)  The format for your next appointment:   In Person  Provider:   Jyl Heinz, MD   Other Instructions  Coronary Calcium Scan A coronary calcium scan is an imaging test used to look for deposits of plaque in the inner lining of the blood vessels of the heart (coronary arteries). Plaque is made up of calcium, protein, and fatty substances. These deposits of plaque can partly clog and narrow the coronary arteries without producing any symptoms or warning signs. This puts a person at risk for a heart attack. This test is recommended for people who are at moderate risk for heart disease. The test can find plaque deposits before symptoms develop. Tell a health care provider about: Any allergies you have. All medicines you are taking, including vitamins, herbs, eye drops, creams, and over-the-counter medicines. Any problems you or family members have had with anesthetic medicines. Any blood disorders you have. Any surgeries you have had. Any medical conditions you have. Whether you are pregnant or may be pregnant. What are the risks? Generally, this is a safe procedure. However, problems may occur, including: Harm to a pregnant woman and her unborn baby. This test involves the use of radiation. Radiation exposure can be dangerous to a pregnant woman and her unborn baby. If you are pregnant or think you may be pregnant, you should not have this procedure done. Slight increase in the risk of cancer. This is because of the  radiation involved in the test. What happens before the procedure? Ask your health care provider for any specific instructions on how to prepare for this procedure. You may be asked to avoid products that contain caffeine, tobacco, or nicotine for 4 hours before the procedure. What happens during the procedure? You will undress and remove any jewelry from your neck or chest. You will put on a hospital gown. Sticky electrodes will be placed on your  chest. The electrodes will be connected to an electrocardiogram (ECG) machine to record a tracing of the electrical activity of your heart. You will lie down on a curved bed that is attached to the [place]. You may be given medicine to slow down your heart rate so that clear pictures can be created. You will be moved into the CT scanner, and the CT scanner will take pictures of your heart. During this time, you will be asked to lie still and hold your breath for 2-3 seconds at a time while each picture of your heart is being taken. The procedure may vary among health care providers and hospitals.    What happens after the procedure? You can get dressed. You can return to your normal activities. It is up to you to get the results of your procedure. Ask your health care provider, or the department that is doing the procedure, when your results will be ready. Summary A coronary calcium scan is an imaging test used to look for deposits of plaque in the inner lining of the blood vessels of the heart (coronary arteries). Plaque is made up of calcium, protein, and fatty substances. Generally, this is a safe procedure. Tell your health care provider if you are pregnant or may be pregnant. Ask your health care provider for any specific instructions on how to prepare for this procedure. A CT scanner will take pictures of your heart. You can return to your normal activities after the scan is done. This information is not intended to replace advice given to you by your health care provider. Make sure you discuss any questions you have with your health care provider. Document Revised: 10/13/2018 Document Reviewed: 10/13/2018 Elsevier Patient Education  [place].

## 2020-12-26 NOTE — Progress Notes (Signed)
Cardiology Office Note:    Date:  12/26/2020   ID:  Mercedes Carter, DOB 01/30/1956, MRN [id number]  PCP:  Philmore Pali, NP  Cardiologist:  Jenean Lindau, MD   Referring MD: Philmore Pali, NP    ASSESSMENT:    1. Aortic atherosclerosis ([place])   2. Mixed hyperlipidemia   3. Obesity (BMI 35.0-39.9 without comorbidity)   4. Primary hypertension    PLAN:    In order of problems listed above:  Aortic atherosclerosis: Secondary prevention stressed with the patient.  Importance of compliance with diet medication stressed and she vocalized understanding.  She is doing well with diet but not exercising and I cautioned her about sedentary lifestyle and stress and she understands.  She was advised to walk at least 5 days a week. Essential hypertension: Blood pressure stable and diet was emphasized lifestyle modification urged. Mixed dyslipidemia: On lipid-lowering therapy.  Lipids are markedly elevated.  She will get an appointment with the dietitian to discuss these issues. Obesity: Weight reduction stressed.  I counseled her against sedentary lifestyle and she promises to do better. Patient will be seen in follow-up appointment in 6 months or earlier if the patient has any concerns    Medication Adjustments/Labs and Tests Ordered: Current medicines are reviewed at length with the patient today.  Concerns regarding medicines are outlined above.  No orders of the defined types were placed in this encounter.  No orders of the defined types were placed in this encounter.    No chief complaint on file.    History of Present Illness:    Mercedes Carter is a 65 y.o. female.  Patient has past medical history of essential hypertension, mixed dyslipidemia and atherosclerosis and obesity.  She leads a sedentary lifestyle.  She denies any chest pain orthopnea or PND.  At the time of my evaluation, the patient is alert awake oriented and in no distress.  She has not made an appointment for a CT  calcium scoring yet.  She had markedly elevated lipids and is started on statin therapy.  Past Medical History:  Diagnosis Date   Acute respiratory failure with hypoxia ([place]) 02/18/2012   Altered mental state 02/18/2012   Anxiety and depression    Aortic atherosclerosis ([place]) 10/23/2020   Arthritis    Asthma    no problems in a year   Complication of anesthesia 02/18/2012   ALOC  DIFFICULT WAKING UP   Condyloma 04/10/2017   DVT (deep venous thrombosis) (HCC)    Left sided   Excessive appetite    Family history of anesthesia complication    mother had difficulty waking   Family history of colon cancer 04/10/2017   Family history of ovarian cancer 04/10/2017   GERD (gastroesophageal reflux disease)    Hepatic steatosis    Hepatomegaly    History of Meniere's disease    Hypercarbia 02/18/2012   Hyperglycemia    Hyperlipemia    Hyperlipidemia    Hypertension    [facility] PCP- had pt. on HCTZ, but took herself off  2 yrs. 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    Past Surgical History:  Procedure Laterality Date   arthroscopic  knee     right knee   CHOLECYSTECTOMY     COLONOSCOPY WITH PROPOFOL N/A 06/05/2017   Procedure: COLONOSCOPY WITH PROPOFOL;  Surgeon: [name], MD;  Location: [facility] ENDOSCOPY;  Service: Gastroenterology;  Laterality: N/A;   COLONOSCOPY WITH PROPOFOL N/A 04/21/2018   Procedure: COLONOSCOPY WITH PROPOFOL;  Surgeon: [name], MD;  Location: [facility] ENDOSCOPY;  Service: Gastroenterology;  Laterality: N/A;   ESOPHAGOGASTRODUODENOSCOPY (EGD) WITH PROPOFOL N/A  11/30/2020   Procedure: ESOPHAGOGASTRODUODENOSCOPY (EGD) WITH PROPOFOL;  Surgeon: [name], MD;  Location: [facility] ENDOSCOPY;  Service: Gastroenterology;  Laterality: N/A;   JOINT REPLACEMENT     TOTAL KNEE ARTHROPLASTY  02/18/2012   Procedure: TOTAL KNEE ARTHROPLASTY;  Surgeon: Meredith Pel, MD;  Location: [place];  Service: Orthopedics;  Laterality: Right;  Right total knee arthroplasty    Current Medications: Current Meds  Medication Sig   acetaminophen (TYLENOL) 325 MG tablet Take 325 mg by mouth as needed for mild pain.   busPIRone (BUSPAR) 15 MG tablet Take 15 mg by mouth 2 (two) times daily.   Cholecalciferol 1.25 MG (50000 UT) capsule Take 50,000 Units by mouth once a week.   DULoxetine (CYMBALTA) 60 MG capsule Take 60 mg by mouth daily.   escitalopram (LEXAPRO) 10 MG tablet Take 10 mg by mouth daily.   levothyroxine (SYNTHROID, LEVOTHROID) 150 MCG tablet Take 150 mcg by mouth daily before breakfast.   metoprolol succinate (TOPROL XL) 50 MG 24 hr tablet Take 1 tablet (50 mg total) by mouth daily.   omeprazole (PRILOSEC) 40 MG capsule Take 1 capsule (40 mg total) by mouth daily.   rosuvastatin (CRESTOR) 20 MG tablet Take 1 tablet (20 mg total) by mouth daily.   TRULICITY 9.52 WU/1.3KG SOPN Inject 0.75 mg into the skin once a week.     Allergies:   Morphine and related, Tetanus toxoids, and Hydrocodone-acetaminophen   Social History   Socioeconomic History   Marital status: Married    Spouse name: Not on file   Number of children: Not on file   Years of education: Not on file   Highest education level: Not on file  Occupational History   Not on file  Tobacco Use   Smoking status: Never   Smokeless tobacco: Never  Vaping Use   Vaping Use: Never used  Substance and Sexual Activity   Alcohol use: Not Currently    Comment: occ   Drug use: No   Sexual activity: Yes    Birth control/protection: Post-menopausal  Other Topics Concern   Not on file  Social History  Narrative   Not on file   Social Determinants of Health   Financial Resource Strain: Not on file  Food Insecurity: Not on file  Transportation Needs: Not on file  Physical Activity: Not on file  Stress: Not on file  Social Connections: Not on file     Family History: The patient's family history includes Asthma in her mother; Breast cancer in her maternal aunt; Colon cancer in her maternal grandmother and another family member; Diabetes in her maternal aunt, maternal grandmother, and mother; Heart disease in her mother; Ovarian cancer in her mother and sister.  ROS:   Please see the history of present illness.    All other systems reviewed and are negative.  EKGs/Labs/Other Studies Reviewed:    The following studies were reviewed today: I discussed  my findings with the patient at length especially lipids   Recent Labs: 11/09/2020: ALT 12; BUN 16; Creatinine, Ser 0.91; Potassium 4.7; Sodium 142; TSH 2.200 11/20/2020: Hemoglobin 13.2; Platelets 318  Recent Lipid Panel    Component Value Date/Time   CHOL 270 (H) 11/09/2020 1147   TRIG 154 (H) 11/09/2020 1147   HDL 47 11/09/2020 1147   CHOLHDL 5.7 (H) 11/09/2020 1147   LDLCALC 194 (H) 11/09/2020 1147    Physical Exam:    VS:  BP 112/74   Pulse (!) 108   Ht 5\' 7"  (1.702 m)   Wt 248 lb 9.6 oz (112.8 kg)   SpO2 95%   BMI 38.94 kg/m     Wt Readings from Last 3 Encounters:  12/26/20 248 lb 9.6 oz (112.8 kg)  11/30/20 241 lb 0.8 oz (109.3 kg)  11/20/20 253 lb 9.6 oz (115 kg)     GEN: Patient is in no acute distress HEENT: Normal NECK: No JVD; No carotid bruits LYMPHATICS: No lymphadenopathy CARDIAC: Hear sounds regular, 2/6 systolic murmur at the apex. RESPIRATORY:  Clear to auscultation without rales, wheezing or rhonchi  ABDOMEN: Soft, non-tender, non-distended MUSCULOSKELETAL:  No edema; No deformity  SKIN: Warm and dry NEUROLOGIC:  Alert and oriented x 3 PSYCHIATRIC:  Normal affect   Signed, Jenean Lindau, MD  12/26/2020 2:19 PM    [facility]

## 2020-12-28 DIAGNOSIS — I10 Essential (primary) hypertension: Secondary | ICD-10-CM | POA: Diagnosis not present

## 2020-12-28 DIAGNOSIS — G4700 Insomnia, unspecified: Secondary | ICD-10-CM | POA: Diagnosis not present

## 2020-12-28 DIAGNOSIS — S6992XA Unspecified injury of left wrist, hand and finger(s), initial encounter: Secondary | ICD-10-CM | POA: Diagnosis not present

## 2020-12-28 DIAGNOSIS — G4733 Obstructive sleep apnea (adult) (pediatric): Secondary | ICD-10-CM | POA: Diagnosis not present

## 2020-12-28 DIAGNOSIS — N644 Mastodynia: Secondary | ICD-10-CM | POA: Diagnosis not present

## 2020-12-28 DIAGNOSIS — E039 Hypothyroidism, unspecified: Secondary | ICD-10-CM | POA: Diagnosis not present

## 2020-12-28 DIAGNOSIS — F419 Anxiety disorder, unspecified: Secondary | ICD-10-CM | POA: Diagnosis not present

## 2020-12-28 DIAGNOSIS — M84442A Pathological fracture, left hand, initial encounter for fracture: Secondary | ICD-10-CM | POA: Diagnosis not present

## 2020-12-28 DIAGNOSIS — S62307A Unspecified fracture of fifth metacarpal bone, left hand, initial encounter for closed fracture: Secondary | ICD-10-CM | POA: Diagnosis not present

## 2020-12-28 DIAGNOSIS — H8109 Meniere's disease, unspecified ear: Secondary | ICD-10-CM | POA: Diagnosis not present

## 2020-12-28 DIAGNOSIS — M170 Bilateral primary osteoarthritis of knee: Secondary | ICD-10-CM | POA: Diagnosis not present

## 2021-01-08 ENCOUNTER — Institutional Professional Consult (permissible substitution): Payer: Medicare PPO | Admitting: Pulmonary Disease

## 2021-01-23 ENCOUNTER — Telehealth: Payer: Self-pay | Admitting: Cardiology

## 2021-01-23 NOTE — Telephone Encounter (Signed)
Spoke with pt who states that she went on a trip to [facility] last week and since then she has been having swelling in her feet, legs and hands. Pt states that she has not been eating increased salt and has been on keto diet. Pt states that Dr. Geraldo Pitter took her off of her hydrochlorothiazide and started her on Toprol XL as she has been having intermittent palpations. Pt states that she took a HCTZ yesterday and held her Toprol. Pt states that the swelling has decreased some. Pt does not have any BP/HR readings or weights. I have advised her to get [employer] Red mobile for her palpations. I also advised her to keep a log of her BP/HR 1-2 hours after her medications and do daily weights. Pt verbalized understanding and had no additional questions.  How do you advise?

## 2021-01-23 NOTE — Telephone Encounter (Signed)
Pt c/o swelling: STAT is pt has developed SOB within 24 hours  How much weight have you gained and in what time span?  Patient is unsure whether or not she gained any weight. She states she doesn't keep track of her weight   If swelling, where is the swelling located?  Feet and legs   Are you currently taking a fluid pill?  No   Are you currently SOB?  No   Do you have a log of your daily weights (if so, list)?  No log available  Have you gained 3 pounds in a day or 5 pounds in a week?  Unsure   Have you traveled recently?  No

## 2021-02-20 ENCOUNTER — Ambulatory Visit: Payer: Medicare PPO | Admitting: Gastroenterology

## 2021-03-26 DIAGNOSIS — R5383 Other fatigue: Secondary | ICD-10-CM | POA: Diagnosis not present

## 2021-03-26 DIAGNOSIS — Z6841 Body Mass Index (BMI) 40.0 and over, adult: Secondary | ICD-10-CM | POA: Diagnosis not present

## 2021-03-26 DIAGNOSIS — R1011 Right upper quadrant pain: Secondary | ICD-10-CM | POA: Diagnosis not present

## 2021-03-26 DIAGNOSIS — N644 Mastodynia: Secondary | ICD-10-CM | POA: Diagnosis not present

## 2021-03-29 ENCOUNTER — Other Ambulatory Visit: Payer: Self-pay

## 2021-03-29 ENCOUNTER — Ambulatory Visit
Admission: RE | Admit: 2021-03-29 | Discharge: 2021-03-29 | Disposition: A | Discharge status: Home / Self Care | Payer: Medicare PPO | Source: Ambulatory Visit | Attending: Nurse Practitioner | Admitting: Nurse Practitioner

## 2021-03-29 DIAGNOSIS — Z1231 Encounter for screening mammogram for malignant neoplasm of breast: Secondary | ICD-10-CM | POA: Insufficient documentation

## 2021-03-29 DIAGNOSIS — N644 Mastodynia: Secondary | ICD-10-CM | POA: Diagnosis not present

## 2021-04-05 IMAGING — CT CT ANGIO CHEST
2 of 4 series · 19 of 31 positions shown · IV contrast (iopamidol)
Comparison: June 23, 2004.

CLINICAL DATA: Shortness of breath.

EXAM:
CT ANGIOGRAPHY CHEST WITH CONTRAST
TECHNIQUE: Multidetector CT imaging of the chest was performed using the
standard protocol during bolus administration of intravenous
contrast. Multiplanar CT image reconstructions and MIPs were
obtained to evaluate the vascular anatomy.
CONTRAST:  75mL 30JYFM-Q8V IOPAMIDOL (30JYFM-Q8V) INJECTION 76%

[Series 10: thins 1.0 b31s · axial · 0.91mm/px · z∈[-204,+59]mm · 14 of 307 slices shown]
[im 22/307  lung]
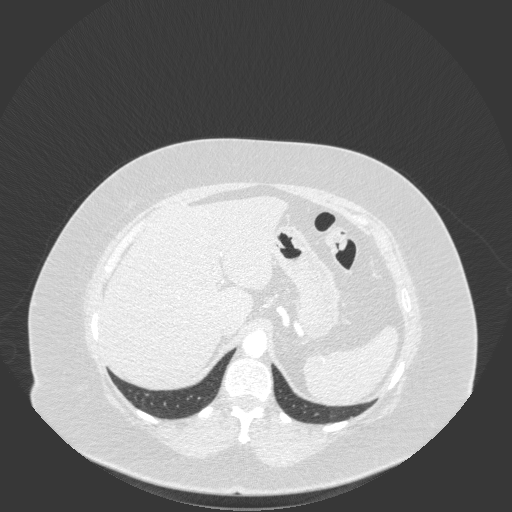
[im 44/307  mediastinal]
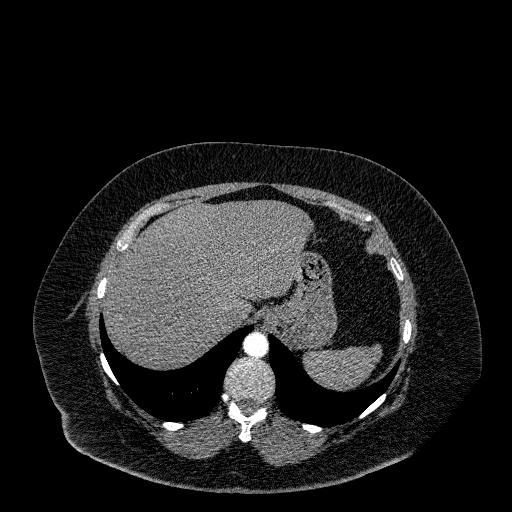
[im 66/307  lung]
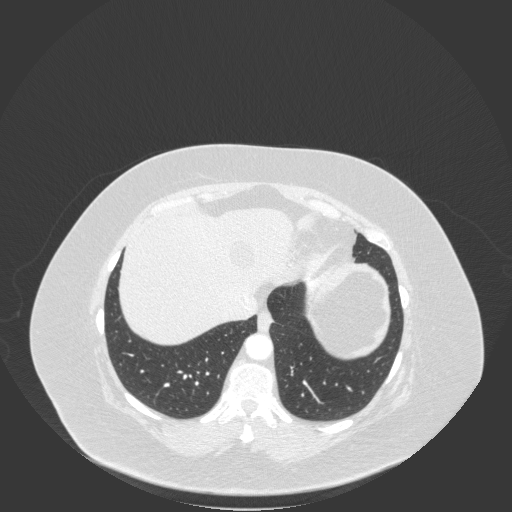
[im 88/307  mediastinal]
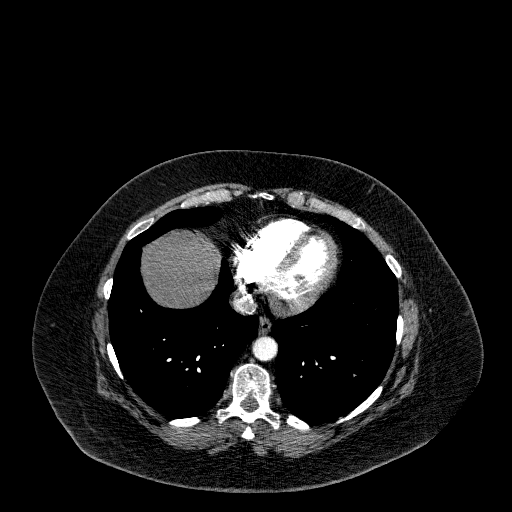
[im 110/307  lung]
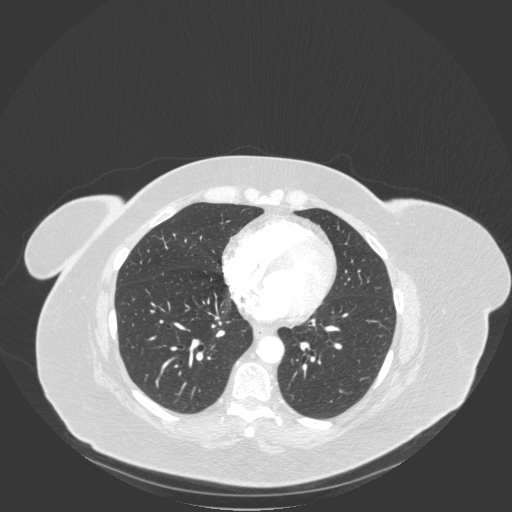
[im 119/307  mediastinal]
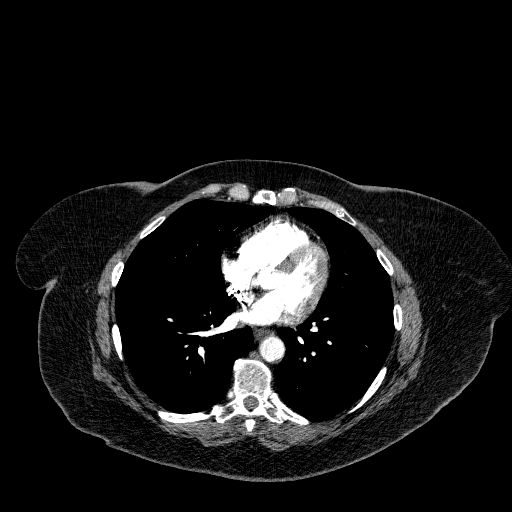
[im 132/307  lung]
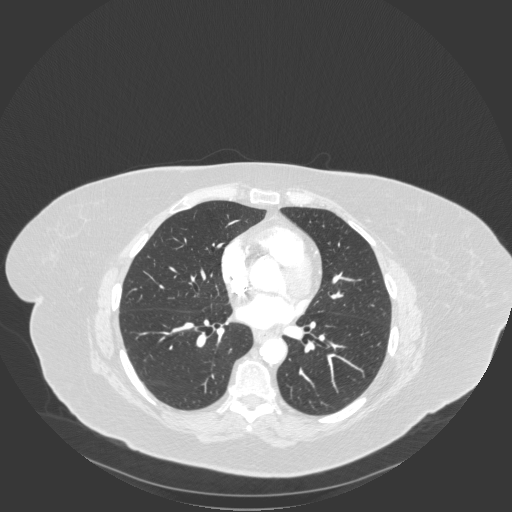
[im 154/307  mediastinal]
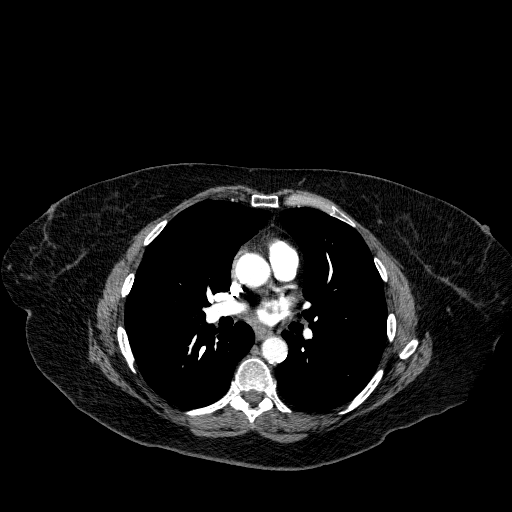
[im 175/307  lung]
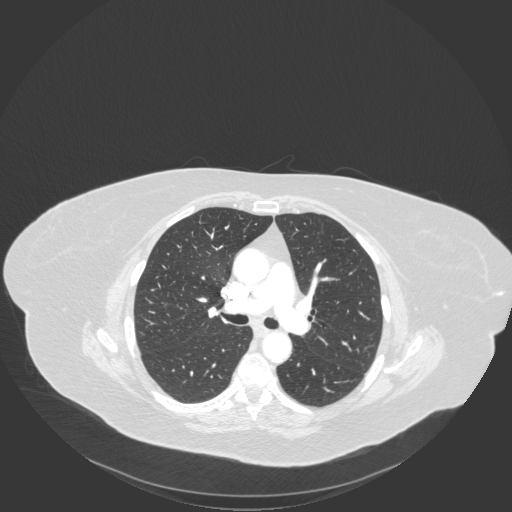
[im 197/307  mediastinal]
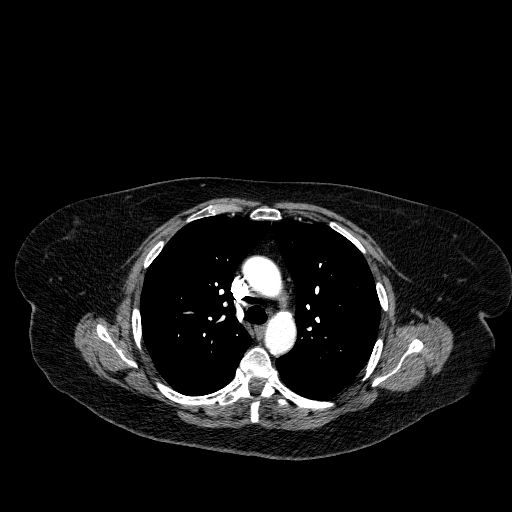
[im 219/307  lung]
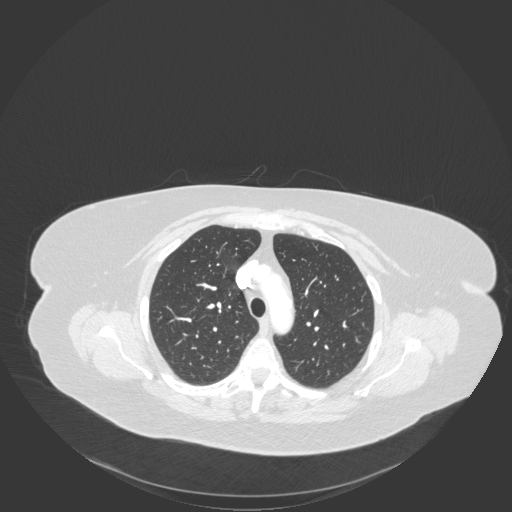
[im 241/307  mediastinal]
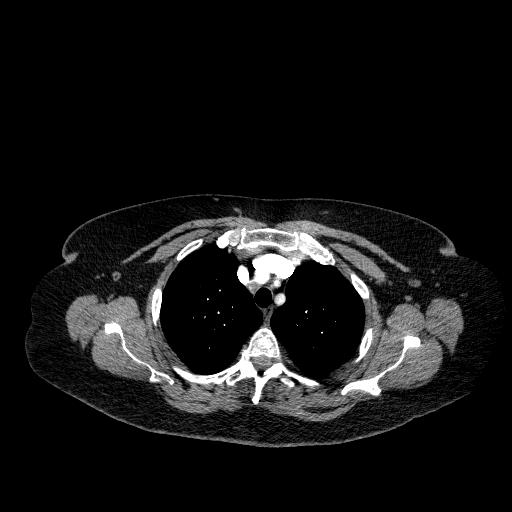
[im 263/307  lung]
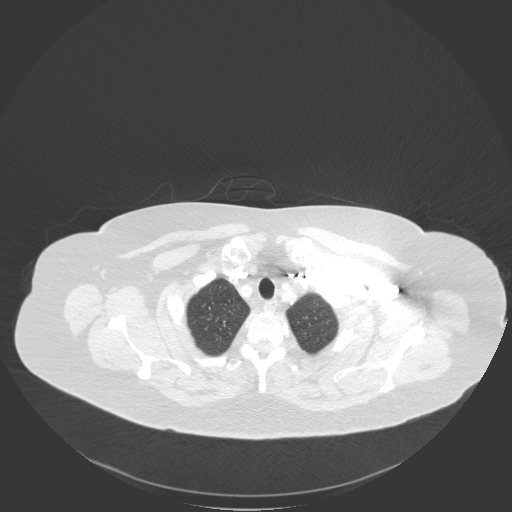
[im 285/307  mediastinal]
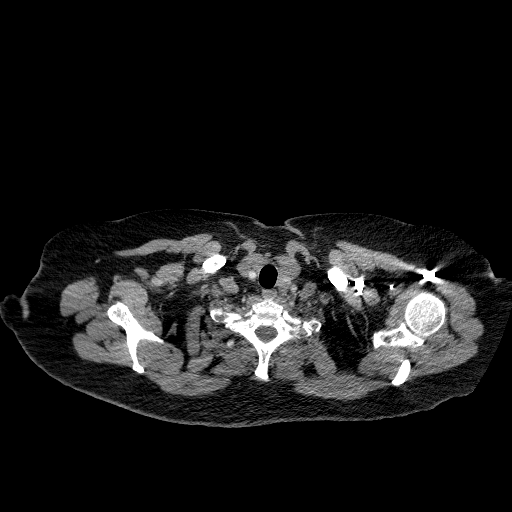

[Series 12: lung · axial · 0.91mm/px · z∈[-174,-36]mm · 5 of 140 slices shown]
[im 24/140  mediastinal]
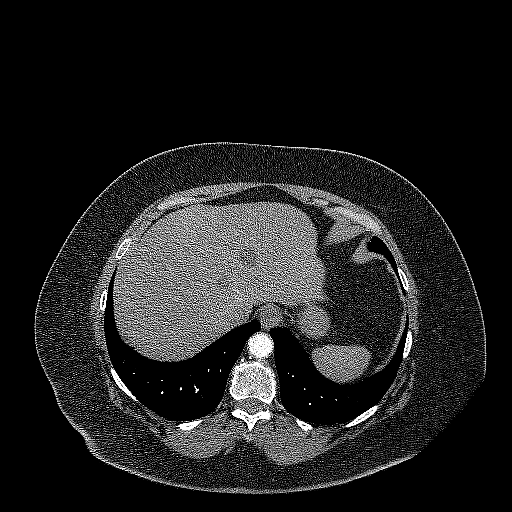
[im 47/140  mediastinal]
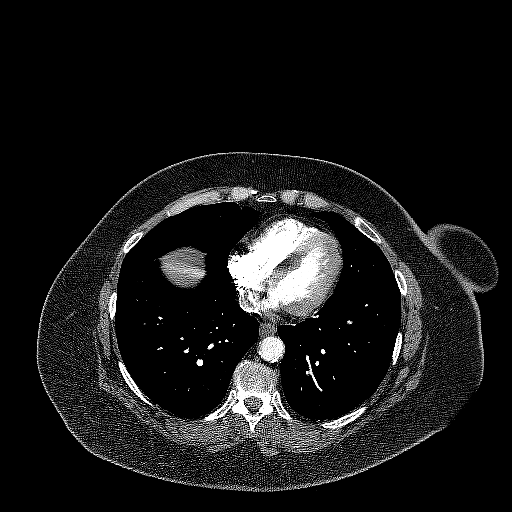
[im 57/140  mediastinal]
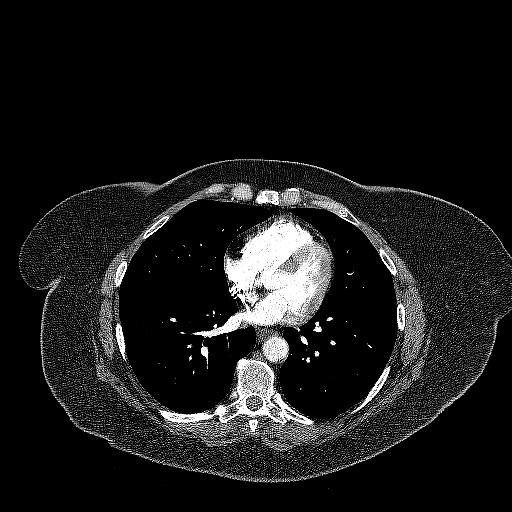
[im 70/140  mediastinal]
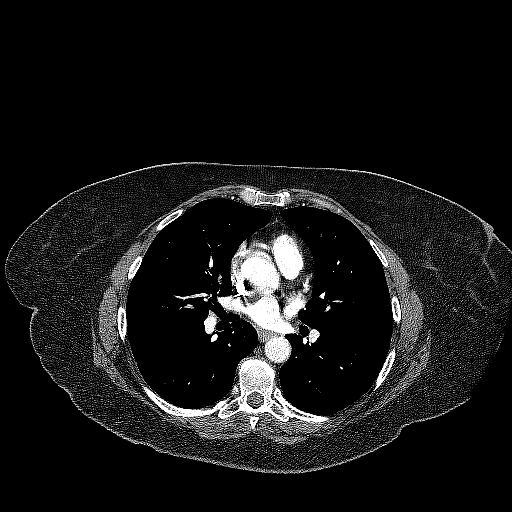
[im 93/140  mediastinal]
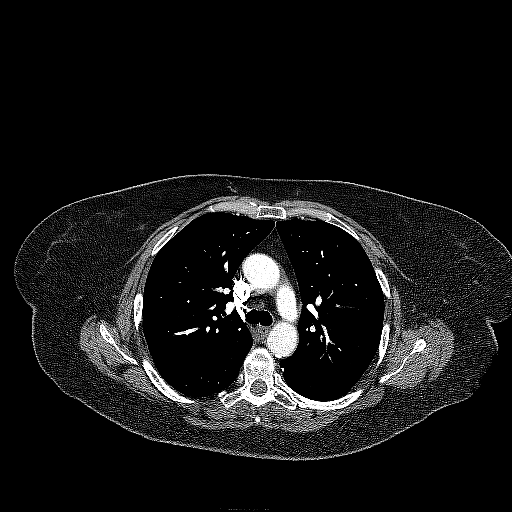

[19 of 31 positions shown; findings below may reference images not displayed]

FINDINGS: Cardiovascular: Satisfactory opacification of the pulmonary arteries
to the segmental level. No evidence of pulmonary embolism. Normal
heart size. No pericardial effusion. Atherosclerosis of thoracic
aorta is noted without aneurysm or dissection.

Mediastinum/Nodes: No enlarged mediastinal, hilar, or axillary lymph
nodes. Thyroid gland, trachea, and esophagus demonstrate no
significant findings.

Lungs/Pleura: Lungs are clear. No pleural effusion or pneumothorax.

Upper Abdomen: No acute abnormality.

Musculoskeletal: No chest wall abnormality. No acute or significant
osseous findings.

Review of the MIP images confirms the above findings.
IMPRESSION: 1. No definite evidence of pulmonary embolus. No acute abnormality
seen in the chest.
2. Aortic atherosclerosis.

Aortic Atherosclerosis ([id number].U).

## 2021-06-27 DIAGNOSIS — E039 Hypothyroidism, unspecified: Secondary | ICD-10-CM | POA: Diagnosis not present

## 2021-06-27 DIAGNOSIS — E785 Hyperlipidemia, unspecified: Secondary | ICD-10-CM | POA: Diagnosis not present

## 2021-06-27 DIAGNOSIS — R609 Edema, unspecified: Secondary | ICD-10-CM | POA: Diagnosis not present

## 2021-06-27 DIAGNOSIS — Z9989 Dependence on other enabling machines and devices: Secondary | ICD-10-CM | POA: Diagnosis not present

## 2021-06-27 DIAGNOSIS — G4733 Obstructive sleep apnea (adult) (pediatric): Secondary | ICD-10-CM | POA: Diagnosis not present

## 2021-06-27 DIAGNOSIS — R400 Somnolence: Secondary | ICD-10-CM | POA: Diagnosis not present

## 2021-06-27 DIAGNOSIS — E790 Hyperuricemia without signs of inflammatory arthritis and tophaceous disease: Secondary | ICD-10-CM | POA: Diagnosis not present

## 2021-06-27 DIAGNOSIS — Z6841 Body Mass Index (BMI) 40.0 and over, adult: Secondary | ICD-10-CM | POA: Diagnosis not present

## 2021-06-28 ENCOUNTER — Ambulatory Visit: Payer: Medicare PPO | Admitting: Cardiology

## 2021-07-02 ENCOUNTER — Encounter (HOSPITAL_BASED_OUTPATIENT_CLINIC_OR_DEPARTMENT_OTHER): Payer: Self-pay

## 2021-07-02 DIAGNOSIS — G4733 Obstructive sleep apnea (adult) (pediatric): Secondary | ICD-10-CM

## 2021-07-20 ENCOUNTER — Ambulatory Visit (HOSPITAL_BASED_OUTPATIENT_CLINIC_OR_DEPARTMENT_OTHER): Payer: Medicare PPO | Attending: Family Medicine | Admitting: Internal Medicine

## 2021-07-20 VITALS — Ht 67.0 in | Wt 254.0 lb

## 2021-07-20 DIAGNOSIS — G4733 Obstructive sleep apnea (adult) (pediatric): Secondary | ICD-10-CM | POA: Diagnosis not present

## 2021-07-28 DIAGNOSIS — G4733 Obstructive sleep apnea (adult) (pediatric): Secondary | ICD-10-CM

## 2021-07-28 NOTE — Procedures (Signed)
? ? ?  Patient Name: Mercedes Carter, Mercedes Carter ?Study Date: 07/20/2021 ?Gender: Female ?D.O.B: 02/04/1956 ?Age (years): 46 ?Referring Provider: Daiva Eves ?Height (inches): 67 ?Interpreting Physician: Baird Lyons MD, ABSM ?Weight (lbs): 254 ?RPSGT: McConnico, Kendrick Fries ?BMI: 40 ?MRN: [id number] ?Neck Size: 15.00 ? ?CLINICAL INFORMATION ?Sleep Study Type: NPSG ?Indication for sleep study: OSA with Insomnia ?Epworth Sleepiness Score: 18 ? ?SLEEP STUDY TECHNIQUE ?As per the AASM Manual for the Scoring of Sleep and Associated Events v2.3 (April 2016) with a hypopnea requiring 4% desaturations. ? ?The channels recorded and monitored were frontal, central and occipital EEG, electrooculogram (EOG), submentalis EMG (chin), nasal and oral airflow, thoracic and abdominal wall motion, anterior tibialis EMG, snore microphone, electrocardiogram, and pulse oximetry. ? ?MEDICATIONS ?Medications self-administered by patient taken the night of the study : LEVOTHYROXINE, alloairilal, METOPROLOL ? ?SLEEP ARCHITECTURE ?The study was initiated at 10:02:27 PM and ended at 4:32:33 AM. ? ?Sleep onset time was 60.4 minutes and the sleep efficiency was 50.9%%. The total sleep time was 198.5 minutes. ? ?Stage REM latency was 32.0 minutes. ? ?The patient spent 3.3%% of the night in stage N1 sleep, 70.0%% in stage N2 sleep, 7.3%% in stage N3 and 19.4% in REM. ? ?Alpha intrusion was absent. ? ?Supine sleep was 40.57%. ? ?RESPIRATORY PARAMETERS ?The overall apnea/hypopnea index (AHI) was 35.4 per hour. There were 31 total apneas, including 30 obstructive, 0 central and 1 mixed apneas. There were 86 hypopneas and 17 RERAs. ? ?The AHI during Stage REM sleep was 46.8 per hour. ? ?AHI while supine was 78.2 per hour. ? ?The mean oxygen saturation was 91.8%. The minimum SpO2 during sleep was 74.0%. ? ?moderate snoring was noted during this study. ? ?CARDIAC DATA ?The 2 lead EKG demonstrated sinus rhythm. The mean heart rate was 70.4 beats per minute. Other EKG  findings include: None. ? ?LEG MOVEMENT DATA ?The total PLMS were 0 with a resulting PLMS index of 0.0. Associated arousal with leg movement index was 0.0 . ? ?IMPRESSIONS ?- Severe obstructive sleep apnea occurred during this study (AHI = 35.4/h). ?- Moderate oxygen desaturation was noted during this study (Min O2 = 74.0%). Mean 92.5% ?- The patient snored with moderate snoring volume. ?- No cardiac abnormalities were noted during this study. ?- Clinically significant periodic limb movements did not occur during sleep. No significant associated arousals. ? ?DIAGNOSIS ?- Obstructive Sleep Apnea (G47.33) ? ?RECOMMENDATIONS ?- Suggest CPAP titration sleep study or autopap. Other options would be based on clinical judgment. ?- Be careful with alcohol, sedatives and other CNS depressants that may worsen sleep apnea and disrupt normal sleep architecture. ?- Sleep hygiene should be reviewed to assess factors that may improve sleep quality. ?- Weight management and regular exercise should be initiated or continued if appropriate. ? ?[Electronically signed] 07/28/2021 01:05 PM ? ?Baird Lyons MD, ABSM ?Diplomate, Tax adviser of Sleep Medicine ?NPI: [id number] ? ?  ? ? ? ? ? ? ? ? ? ? ? ? ? ? ? ? ? ? ? ? ? ?Mercedes Carter ?Diplomate, Tax adviser of Sleep Medicine ? ?ELECTRONICALLY SIGNED ON:  07/28/2021, 1:02 PM ?[facility] ?PH: [phone]   FX: [phone] ?ACCREDITED BY THE AMERICAN ACADEMY OF SLEEP MEDICINE ?

## 2021-08-22 ENCOUNTER — Ambulatory Visit: Payer: Medicare PPO | Admitting: Orthopedic Surgery

## 2021-09-17 DIAGNOSIS — Z9181 History of falling: Secondary | ICD-10-CM | POA: Diagnosis not present

## 2021-09-17 DIAGNOSIS — Z0000 Encounter for general adult medical examination without abnormal findings: Secondary | ICD-10-CM | POA: Diagnosis not present

## 2021-09-17 DIAGNOSIS — E785 Hyperlipidemia, unspecified: Secondary | ICD-10-CM | POA: Diagnosis not present

## 2021-09-17 DIAGNOSIS — Z139 Encounter for screening, unspecified: Secondary | ICD-10-CM | POA: Diagnosis not present

## 2021-09-17 DIAGNOSIS — E669 Obesity, unspecified: Secondary | ICD-10-CM | POA: Diagnosis not present

## 2021-09-17 DIAGNOSIS — Z1331 Encounter for screening for depression: Secondary | ICD-10-CM | POA: Diagnosis not present

## 2021-10-10 ENCOUNTER — Other Ambulatory Visit (HOSPITAL_BASED_OUTPATIENT_CLINIC_OR_DEPARTMENT_OTHER): Payer: Self-pay

## 2021-10-10 DIAGNOSIS — G4733 Obstructive sleep apnea (adult) (pediatric): Secondary | ICD-10-CM

## 2021-11-05 ENCOUNTER — Other Ambulatory Visit: Payer: Self-pay | Admitting: Cardiology

## 2022-01-27 ENCOUNTER — Other Ambulatory Visit: Payer: Self-pay | Admitting: Cardiology

## 2022-02-07 DIAGNOSIS — R1310 Dysphagia, unspecified: Secondary | ICD-10-CM | POA: Diagnosis not present

## 2022-02-22 ENCOUNTER — Other Ambulatory Visit: Payer: Self-pay | Admitting: Cardiology

## 2022-02-22 NOTE — Telephone Encounter (Signed)
Refill # 15 only with message patient needs appointment / 2nd attempt

## 2022-03-15 ENCOUNTER — Telehealth: Payer: Self-pay | Admitting: Cardiology

## 2022-03-15 MED ORDER — METOPROLOL SUCCINATE ER 50 MG PO TB24: 50.0000 mg | ORAL_TABLET | Freq: Every day | ORAL | 0 refills | Status: DC

## 2022-03-15 NOTE — Telephone Encounter (Signed)
Sent to wrong pool. \

## 2022-03-15 NOTE — Telephone Encounter (Signed)
Refill of Metoprolol Succinate 50 mg sent to [facility] with note stating patient must keep 05-08-22 appointment or no refills will be provided.

## 2022-03-15 NOTE — Telephone Encounter (Signed)
*  STAT* If patient is at the pharmacy, call can be transferred to refill team.   1. Which medications need to be refilled? (please list name of each medication and dose if known) metoprolol succinate (TOPROL-XL) 50 MG 24 hr tablet   2. Which pharmacy/location (including street and city if local pharmacy) is medication to be sent to?  [id number] [place], [place] - [address]   3. Do they need a 30 day or 90 day supply? [phone] Pt requesting enough supply to last until her appt 05/08/22

## 2022-05-02 ENCOUNTER — Other Ambulatory Visit: Payer: Self-pay

## 2022-05-07 DIAGNOSIS — L749 Eccrine sweat disorder, unspecified: Secondary | ICD-10-CM | POA: Diagnosis not present

## 2022-05-07 DIAGNOSIS — Z1231 Encounter for screening mammogram for malignant neoplasm of breast: Secondary | ICD-10-CM | POA: Diagnosis not present

## 2022-05-07 DIAGNOSIS — K219 Gastro-esophageal reflux disease without esophagitis: Secondary | ICD-10-CM | POA: Diagnosis not present

## 2022-05-07 DIAGNOSIS — E559 Vitamin D deficiency, unspecified: Secondary | ICD-10-CM | POA: Diagnosis not present

## 2022-05-07 DIAGNOSIS — R5381 Other malaise: Secondary | ICD-10-CM | POA: Diagnosis not present

## 2022-05-07 DIAGNOSIS — Z139 Encounter for screening, unspecified: Secondary | ICD-10-CM | POA: Diagnosis not present

## 2022-05-07 DIAGNOSIS — R635 Abnormal weight gain: Secondary | ICD-10-CM | POA: Diagnosis not present

## 2022-05-07 DIAGNOSIS — R5383 Other fatigue: Secondary | ICD-10-CM | POA: Diagnosis not present

## 2022-05-08 ENCOUNTER — Ambulatory Visit: Payer: Medicare PPO | Attending: Cardiology | Admitting: Cardiology

## 2022-05-08 ENCOUNTER — Encounter: Payer: Self-pay | Admitting: Cardiology

## 2022-05-08 VITALS — BP 132/84 | HR 111 | Ht 67.0 in | Wt 277.0 lb

## 2022-05-08 DIAGNOSIS — I700 Atherosclerosis of aorta: Secondary | ICD-10-CM

## 2022-05-08 DIAGNOSIS — E782 Mixed hyperlipidemia: Secondary | ICD-10-CM | POA: Diagnosis not present

## 2022-05-08 DIAGNOSIS — G4733 Obstructive sleep apnea (adult) (pediatric): Secondary | ICD-10-CM

## 2022-05-08 MED ORDER — METOPROLOL SUCCINATE ER 50 MG PO TB24: 50.0000 mg | ORAL_TABLET | Freq: Every day | ORAL | 3 refills | Status: DC

## 2022-05-08 NOTE — Patient Instructions (Signed)
Medication Instructions:  Your physician recommends that you continue on your current medications as directed. Please refer to the Current Medication list given to you today.  *If you need a refill on your cardiac medications before your next appointment, please call your pharmacy*   Lab Work: Will request copy of your labs from PCP If you have labs (blood work) drawn today and your tests are completely normal, you will receive your results only by: [place] (if you have MyChart) OR A paper copy in the mail If you have any lab test that is abnormal or we need to change your treatment, we will call you to review the results.   Testing/Procedures: NONE   Follow-Up: At [facility], you and your health needs are our priority.  As part of our continuing mission to provide you with exceptional heart care, we have created designated Provider Care Teams.  These Care Teams include your primary Cardiologist (physician) and Advanced Practice Providers (APPs -  Physician Assistants and Nurse Practitioners) who all work together to provide you with the care you need, when you need it.  We recommend signing up for the patient portal called "MyChart".  Sign up information is provided on this After Visit Summary.  MyChart is used to connect with patients for Virtual Visits (Telemedicine).  Patients are able to view lab/test results, encounter notes, upcoming appointments, etc.  Non-urgent messages can be sent to your provider as well.   To learn more about what you can do with MyChart, go to [email or web address].    Your next appointment:   9 month(s)  Provider:   Jyl Heinz, MD    Other Instructions

## 2022-05-08 NOTE — Progress Notes (Signed)
Cardiology Office Note:    Date:  05/08/2022   ID:  Mercedes Carter, DOB June 14, 1955, MRN [id number]  PCP:  Philmore Pali, NP (Inactive)  Cardiologist:  Jenean Lindau, MD   Referring MD: No ref. provider found    ASSESSMENT:    1. Aortic atherosclerosis ([place])   2. OSA (obstructive sleep apnea)   3. Morbid obesity ([place])    PLAN:    In order of problems listed above:  Primary prevention stressed with the patient.  Importance of compliance with diet medication stressed and she vocalized understanding.  She was advised to walk at least half an hour a day 5 days a week and she promises to do so. Essential hypertension: Blood pressure stable and diet was emphasized. Mixed dyslipidemia: She is not taking statin therapy.  She developed cramps with rosuvastatin.  Will get a copy of her blood work done yesterday by primary care and discussed this with her when the blood work is available. Aortic arterial sclerosis: Stable and secondary prevention and lipid lowering urged. Morbid obesity: Weight reduction stressed and diet emphasized.  Weight has gone up significantly over the past several months and I cautioned her about this. Patient will be seen in follow-up appointment in 6 months or earlier if the patient has any concerns    Medication Adjustments/Labs and Tests Ordered: Current medicines are reviewed at length with the patient today.  Concerns regarding medicines are outlined above.  No orders of the defined types were placed in this encounter.  No orders of the defined types were placed in this encounter.    No chief complaint on file.    History of Present Illness:    Mercedes Carter is a 67 y.o. female.  Patient has past medical history of aortic atherosclerosis, obstructive sleep apnea and morbid obesity.  He also has history of hyperlipidemia.  She denies any problems at this time and takes care of activities of daily living.  No chest pain orthopnea or PND.  At the time  of my evaluation, the patient is alert awake oriented and in no distress.  Past Medical History:  Diagnosis Date   Acute respiratory failure with hypoxia ([place]) 02/18/2012   Altered mental state 02/18/2012   Anxiety and depression    Aortic atherosclerosis ([place]) 10/23/2020   Arthritis    Asthma    no problems in a year   Complication of anesthesia 02/18/2012   ALOC  DIFFICULT WAKING UP   Condyloma 04/10/2017   DVT (deep venous thrombosis) (HCC)    Left sided   Excessive appetite    Family history of anesthesia complication    mother had difficulty waking   Family history of colon cancer 04/10/2017   Family history of ovarian cancer 04/10/2017   GERD (gastroesophageal reflux disease)    Hepatic steatosis    Hepatomegaly    History of Meniere's disease    Hypercarbia 02/18/2012   Hyperglycemia    Hyperlipemia    Hyperlipidemia    Hypertension    [facility] PCP- had pt. on HCTZ, but took herself off  2 yrs. 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    Past Surgical History:  Procedure Laterality Date   arthroscopic  knee     right knee   CHOLECYSTECTOMY     COLONOSCOPY WITH PROPOFOL N/A 06/05/2017   Procedure: COLONOSCOPY WITH PROPOFOL;  Surgeon: [name], MD;  [facility] ENDOSCOPY;  Service: Gastroenterology;  Laterality: N/A;   COLONOSCOPY WITH PROPOFOL N/A 04/21/2018   Procedure: COLONOSCOPY WITH PROPOFOL;  Surgeon: [name], MD;  Location: [facility] ENDOSCOPY;  Service: Gastroenterology;  Laterality: N/A;   ESOPHAGOGASTRODUODENOSCOPY (EGD) WITH PROPOFOL N/A 11/30/2020    Procedure: ESOPHAGOGASTRODUODENOSCOPY (EGD) WITH PROPOFOL;  Surgeon: [name], MD;  Location: [facility] ENDOSCOPY;  Service: Gastroenterology;  Laterality: N/A;   JOINT REPLACEMENT     TOTAL KNEE ARTHROPLASTY  02/18/2012   Procedure: TOTAL KNEE ARTHROPLASTY;  Surgeon: Meredith Pel, MD;  Location: [place];  Service: Orthopedics;  Laterality: Right;  Right total knee arthroplasty    Current Medications: Current Meds  Medication Sig   acetaminophen (TYLENOL) 325 MG tablet Take 325 mg by mouth as needed for mild pain.   allopurinol (ZYLOPRIM) 100 MG tablet Take 100 mg by mouth daily.   busPIRone (BUSPAR) 15 MG tablet Take 15 mg by mouth 2 (two) times daily.   Cholecalciferol 1.25 MG (50000 UT) capsule Take 50,000 Units by mouth once a week.   DULoxetine (CYMBALTA) 60 MG capsule Take 60 mg by mouth daily.   escitalopram (LEXAPRO) 10 MG tablet Take 10 mg by mouth daily.   levothyroxine (SYNTHROID, LEVOTHROID) 150 MCG tablet Take 150 mcg by mouth daily before breakfast.   metoprolol succinate (TOPROL-XL) 50 MG 24 hr tablet Take 1 tablet (50 mg total) by mouth daily. Patient must keep appointment for 05/08/22 for further refills. 3rd/final attempt   omeprazole (PRILOSEC) 40 MG capsule Take 1 capsule (40 mg total) by mouth daily.     Allergies:   Morphine and related, Tetanus toxoids, Crestor [rosuvastatin], and Hydrocodone-acetaminophen   Social History   Socioeconomic History   Marital status: Married    Spouse name: Not on file   Number of children: Not on file   Years of education: Not on file   Highest education level: Not on file  Occupational History   Not on file  Tobacco Use   Smoking status: Never   Smokeless tobacco: Never  Vaping Use   Vaping Use: Never used  Substance and Sexual Activity   Alcohol use: Not Currently    Comment: occ   Drug use: No   Sexual activity: Yes    Birth control/protection: Post-menopausal  Other Topics Concern   Not on file  Social History  Narrative   Not on file   Social Determinants of Health   Financial Resource Strain: Not on file  Food Insecurity: Not on file  Transportation Needs: Not on file  Physical Activity: Not on file  Stress: Not on file  Social Connections: Not on file     Family History: The patient's family history includes Asthma in her mother; Breast cancer in her maternal aunt; Colon cancer in her maternal grandmother and another family member; Diabetes in her maternal aunt, maternal grandmother, and mother; Heart disease in her mother; Ovarian cancer in her mother and sister.  ROS:   Please see the history of present illness.    All other systems reviewed and are negative.  EKGs/Labs/Other Studies Reviewed:    The following studies were reviewed today: EKG reveals sinus rhythm and nonspecific  ST-T changes   Recent Labs: No results found for requested labs within last 365 days.  Recent Lipid Panel    Component Value Date/Time   CHOL 270 (H) 11/09/2020 1147   TRIG 154 (H) 11/09/2020 1147   HDL 47 11/09/2020 1147   CHOLHDL 5.7 (H) 11/09/2020 1147   LDLCALC 194 (H) 11/09/2020 1147    Physical Exam:    VS:  BP 132/84   Pulse (!) 111   Ht '5\' 7"'$  (1.702 m)   Wt 277 lb (125.6 kg)   SpO2 95%   BMI 43.38 kg/m     Wt Readings from Last 3 Encounters:  05/08/22 277 lb (125.6 kg)  07/20/21 254 lb (115.2 kg)  12/26/20 248 lb 9.6 oz (112.8 kg)     GEN: Patient is in no acute distress HEENT: Normal NECK: No JVD; No carotid bruits LYMPHATICS: No lymphadenopathy CARDIAC: Hear sounds regular, 2/6 systolic murmur at the apex. RESPIRATORY:  Clear to auscultation without rales, wheezing or rhonchi  ABDOMEN: Soft, non-tender, non-distended MUSCULOSKELETAL:  No edema; No deformity  SKIN: Warm and dry NEUROLOGIC:  Alert and oriented x 3 PSYCHIATRIC:  Normal affect   Signed, Jenean Lindau, MD  05/08/2022 4:39 PM    [facility]

## 2022-05-22 DIAGNOSIS — G4700 Insomnia, unspecified: Secondary | ICD-10-CM | POA: Diagnosis not present

## 2022-05-22 DIAGNOSIS — E0590 Thyrotoxicosis, unspecified without thyrotoxic crisis or storm: Secondary | ICD-10-CM | POA: Diagnosis not present

## 2022-05-22 DIAGNOSIS — F418 Other specified anxiety disorders: Secondary | ICD-10-CM | POA: Diagnosis not present

## 2022-05-22 DIAGNOSIS — M25561 Pain in right knee: Secondary | ICD-10-CM | POA: Diagnosis not present

## 2022-05-22 DIAGNOSIS — G4733 Obstructive sleep apnea (adult) (pediatric): Secondary | ICD-10-CM | POA: Diagnosis not present

## 2022-05-22 DIAGNOSIS — M25562 Pain in left knee: Secondary | ICD-10-CM | POA: Diagnosis not present

## 2022-05-22 DIAGNOSIS — M25551 Pain in right hip: Secondary | ICD-10-CM | POA: Diagnosis not present

## 2022-05-22 DIAGNOSIS — G8929 Other chronic pain: Secondary | ICD-10-CM | POA: Diagnosis not present

## 2022-05-22 DIAGNOSIS — M25552 Pain in left hip: Secondary | ICD-10-CM | POA: Diagnosis not present

## 2022-08-19 ENCOUNTER — Encounter: Payer: Self-pay | Admitting: Orthopedic Surgery

## 2022-08-19 ENCOUNTER — Other Ambulatory Visit (INDEPENDENT_AMBULATORY_CARE_PROVIDER_SITE_OTHER): Payer: Medicare PPO

## 2022-08-19 ENCOUNTER — Ambulatory Visit: Payer: Medicare PPO | Admitting: Orthopedic Surgery

## 2022-08-19 VITALS — Ht 67.0 in | Wt 280.0 lb

## 2022-08-19 DIAGNOSIS — M25562 Pain in left knee: Secondary | ICD-10-CM

## 2022-08-19 DIAGNOSIS — M1712 Unilateral primary osteoarthritis, left knee: Secondary | ICD-10-CM | POA: Diagnosis not present

## 2022-08-19 DIAGNOSIS — M7989 Other specified soft tissue disorders: Secondary | ICD-10-CM

## 2022-08-19 DIAGNOSIS — M25561 Pain in right knee: Secondary | ICD-10-CM | POA: Diagnosis not present

## 2022-08-19 NOTE — Progress Notes (Unsigned)
Office Visit Note   Patient: Mercedes Carter           Date of Birth: 1955/09/04           MRN: [id number] Visit Date: 08/19/2022 Requested by: No referring provider defined for this encounter. PCP: Ronal Fear, NP (Inactive)  Subjective: Chief Complaint  Patient presents with   Right Knee - Pain   Left Knee - Pain    HPI: Mercedes Carter is a 67 y.o. female who presents to the office reporting left knee pain and to a lesser degree right knee pain.  She underwent right total knee replacement years ago.  Has a history of DVT in the left leg and she feels like she is getting cramping and pain in that left leg comparable to when she has had blood clots in the past.  She ambulates with a cane.  Likes to play [employer]	[place] but has a hard time doing that with her left knee in his current condition.  Also reporting some muscle spasms in her legs.  Does have a history of low back pain.  Denies any history of injury..                ROS: All systems reviewed are negative as they relate to the chief complaint within the history of present illness.  Patient denies fevers or chills.  Assessment & Plan: Visit Diagnoses:  1. Pain in both knees, unspecified chronicity   2. Swelling of left lower extremity     Plan: Impression is end-stage arthritis in the left knee with well-functioning right total knee replacement.  Plan is ultrasound left lower extremity rule out DVT.  She does have some calf tenderness and positive Homans.  Injection performed into the left knee as well.  She is considering knee replacement at sometime in the future.  She would need to lose some weight because her BMI currently is 44.  She would need to be under 40 to consider knee replacement.  She will follow-up as needed.  Tolerated the injection well.  Follow-Up Instructions: No follow-ups on file.   Orders:  Orders Placed This Encounter  Procedures   XR KNEE 3 VIEW RIGHT   XR Knee 1-2 Views Left   VAS [place] LOWER  EXTREMITY VENOUS (DVT)   No orders of the defined types were placed in this encounter.     Procedures: Large Joint Inj: L knee on 08/19/2022 10:59 PM Indications: diagnostic evaluation, joint swelling and pain Details: 18 G 1.5 in needle, superolateral approach  Arthrogram: No  Medications: 5 mL lidocaine 1 %; 40 mg methylPREDNISolone acetate 40 MG/ML; 4 mL bupivacaine 0.25 % Outcome: tolerated well, no immediate complications Procedure, treatment alternatives, risks and benefits explained, specific risks discussed. Consent was given by the patient. Immediately prior to procedure a time out was called to verify the correct patient, procedure, equipment, support staff and site/side marked as required. Patient was prepped and draped in the usual sterile fashion.       Clinical Data: No additional findings.  Objective: Vital Signs: Ht 5\' 7"  (1.702 m)   Wt 280 lb (127 kg)   BMI 43.85 kg/m   Physical Exam:  Constitutional: Patient appears well-developed HEENT:  Head: Normocephalic Eyes:EOM are normal Neck: Normal range of motion Cardiovascular: Normal rate Pulmonary/chest: Effort normal Neurologic: Patient is alert Skin: Skin is warm Psychiatric: Patient has normal mood and affect  Ortho Exam: Ortho exam demonstrates range of motion of the right  knee is 0-1 10 with no effusion good patella tracking and good stability.  On the left knee she has range of motion of about 0-100.  Extensor mechanism is intact.  Pedal pulses palpable.  She does have positive calf tenderness to palpation as well as positive Homans.  Ankle dorsiflexion intact.  Has medial and lateral joint line tenderness to palpation on that left-hand side  Specialty Comments:  No specialty comments available.  Imaging: XR Knee 1-2 Views Left  Result Date: 08/19/2022 AP lateral radiographs left knee reviewed.  Severe end-stage tricompartmental arthritis is present with mild varus alignment significant osteophytes  in the medial and lateral compartments along with bone-on-bone changes with some erosion in the medial compartment.  No acute fracture.  XR KNEE 3 VIEW RIGHT  Result Date: 08/19/2022 AP lateral merchant radiographs right knee reviewed.  Total knee prosthesis in good position alignment.  No lucencies around the bone cement interface.  No acute fracture.    PMFS History: Patient Active Problem List   Diagnosis Date Noted   Aortic atherosclerosis (HCC) 10/23/2020   Morbid obesity (HCC) 10/23/2020   Anxiety and depression 10/20/2020   Arthritis 10/20/2020   DVT (deep venous thrombosis) (HCC) 10/20/2020   Excessive appetite 10/20/2020   Family history of anesthesia complication 10/20/2020   GERD (gastroesophageal reflux disease) 10/20/2020   Hepatic steatosis 10/20/2020   Hepatomegaly 10/20/2020   History of Meniere's disease 10/20/2020   Hyperglycemia 10/20/2020   Hyperlipemia 10/20/2020   Hyperlipidemia 10/20/2020   Hypertension 10/20/2020   Incontinence of urine 10/20/2020   Low back pain with sciatica 10/20/2020   Osteoarthritis of knee 10/20/2020   Serum calcium elevated 10/20/2020   Obesity (BMI 35.0-39.9 without comorbidity) 04/10/2017   Condyloma 04/10/2017   Family history of colon cancer 04/10/2017   Family history of ovarian cancer 04/10/2017   Pelvic pain 04/10/2017   STD exposure 04/10/2017   Acute respiratory failure with hypoxia (HCC) 02/18/2012   Hypercarbia 02/18/2012   Respiratory failure, post-operative (HCC) 02/18/2012   Asthma 02/18/2012   Obesity 02/18/2012   Altered mental state 02/18/2012   Hypothyroid 02/18/2012   OSA (obstructive sleep apnea) 02/18/2012   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   Complication of anesthesia 02/18/2012   Past Medical History:  Diagnosis Date   Acute respiratory failure with hypoxia (HCC) 02/18/2012   Altered mental state 02/18/2012   Anxiety and depression    Aortic atherosclerosis (HCC) 10/23/2020   Arthritis     Asthma    no problems in a year   Complication of anesthesia 02/18/2012   ALOC  DIFFICULT WAKING UP   Condyloma 04/10/2017   DVT (deep venous thrombosis) (HCC)    Left sided   Excessive appetite    Family history of anesthesia complication    mother had difficulty waking   Family history of colon cancer 04/10/2017   Family history of ovarian cancer 04/10/2017   GERD (gastroesophageal reflux disease)    Hepatic steatosis    Hepatomegaly    History of Meniere's disease    Hypercarbia 02/18/2012   Hyperglycemia    Hyperlipemia    Hyperlipidemia    Hypertension    [facility] PCP- had pt. on HCTZ, but took herself off  2 yrs. 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    Family History  Problem Relation Age of Onset   Ovarian cancer Mother    Diabetes Mother    Asthma Mother    Heart disease Mother    Ovarian cancer Sister    Colon cancer Maternal Grandmother    Diabetes Maternal Grandmother    Diabetes Maternal Aunt    Breast cancer Maternal Aunt    Colon cancer Other     Past Surgical History:  Procedure Laterality Date   arthroscopic  knee     right knee   CHOLECYSTECTOMY     COLONOSCOPY WITH PROPOFOL N/A 06/05/2017   Procedure: COLONOSCOPY WITH PROPOFOL;  Surgeon: Wyline Mood, MD;  Location: [facility] ENDOSCOPY;  Service: Gastroenterology;  Laterality: N/A;   COLONOSCOPY WITH PROPOFOL N/A 04/21/2018   Procedure: COLONOSCOPY WITH PROPOFOL;  Surgeon: Wyline Mood, MD;  Location: [facility] ENDOSCOPY;  Service: Gastroenterology;  Laterality: N/A;   ESOPHAGOGASTRODUODENOSCOPY (EGD) WITH PROPOFOL  N/A 11/30/2020   Procedure: ESOPHAGOGASTRODUODENOSCOPY (EGD) WITH PROPOFOL;  Surgeon: Wyline Mood, MD;  Location: [facility] ENDOSCOPY;  Service: Gastroenterology;  Laterality: N/A;   JOINT REPLACEMENT     TOTAL KNEE ARTHROPLASTY  02/18/2012   Procedure: TOTAL KNEE ARTHROPLASTY;  Surgeon: Cammy Copa, MD;  Location: [facility] OR;  Service: Orthopedics;  Laterality: Right;  Right total knee arthroplasty   Social History   Occupational History   Not on file  Tobacco Use   Smoking status: Never   Smokeless tobacco: Never  Vaping Use   Vaping Use: Never used  Substance and Sexual Activity   Alcohol use: Not Currently    Comment: occ   Drug use: No   Sexual activity: Yes    Birth control/protection: Post-menopausal

## 2022-08-20 ENCOUNTER — Ambulatory Visit (HOSPITAL_COMMUNITY)
Admission: RE | Admit: 2022-08-20 | Discharge: 2022-08-20 | Disposition: A | Discharge status: Home / Self Care | Payer: Medicare PPO | Source: Ambulatory Visit | Attending: Orthopedic Surgery | Admitting: Orthopedic Surgery

## 2022-08-20 DIAGNOSIS — M7989 Other specified soft tissue disorders: Secondary | ICD-10-CM | POA: Diagnosis not present

## 2022-08-20 MED ORDER — LIDOCAINE HCL 1 % IJ SOLN
5.0000 mL | INTRAMUSCULAR | Status: AC | PRN
Start: 2022-08-19 — End: 2022-08-19
  Administered 2022-08-19: 5 mL

## 2022-08-20 MED ORDER — METHYLPREDNISOLONE ACETATE 40 MG/ML IJ SUSP
40.0000 mg | INTRAMUSCULAR | Status: AC | PRN
Start: 2022-08-19 — End: 2022-08-19
  Administered 2022-08-19: 40 mg via INTRA_ARTICULAR

## 2022-08-20 MED ORDER — BUPIVACAINE HCL 0.25 % IJ SOLN
4.0000 mL | INTRAMUSCULAR | Status: AC | PRN
Start: 2022-08-19 — End: 2022-08-19
  Administered 2022-08-19: 4 mL via INTRA_ARTICULAR

## 2022-09-05 DIAGNOSIS — E0590 Thyrotoxicosis, unspecified without thyrotoxic crisis or storm: Secondary | ICD-10-CM | POA: Diagnosis not present

## 2022-09-05 DIAGNOSIS — F418 Other specified anxiety disorders: Secondary | ICD-10-CM | POA: Diagnosis not present

## 2022-09-05 DIAGNOSIS — M25561 Pain in right knee: Secondary | ICD-10-CM | POA: Diagnosis not present

## 2022-09-05 DIAGNOSIS — N644 Mastodynia: Secondary | ICD-10-CM | POA: Diagnosis not present

## 2022-09-05 DIAGNOSIS — R1010 Upper abdominal pain, unspecified: Secondary | ICD-10-CM | POA: Diagnosis not present

## 2022-09-05 DIAGNOSIS — M25551 Pain in right hip: Secondary | ICD-10-CM | POA: Diagnosis not present

## 2022-09-05 DIAGNOSIS — I10 Essential (primary) hypertension: Secondary | ICD-10-CM | POA: Diagnosis not present

## 2022-09-05 DIAGNOSIS — G8929 Other chronic pain: Secondary | ICD-10-CM | POA: Diagnosis not present

## 2022-09-05 DIAGNOSIS — M25562 Pain in left knee: Secondary | ICD-10-CM | POA: Diagnosis not present

## 2022-09-05 DIAGNOSIS — M25552 Pain in left hip: Secondary | ICD-10-CM | POA: Diagnosis not present

## 2022-09-25 DIAGNOSIS — K5900 Constipation, unspecified: Secondary | ICD-10-CM | POA: Diagnosis not present

## 2022-09-25 DIAGNOSIS — G4733 Obstructive sleep apnea (adult) (pediatric): Secondary | ICD-10-CM | POA: Diagnosis not present

## 2022-09-25 DIAGNOSIS — E039 Hypothyroidism, unspecified: Secondary | ICD-10-CM | POA: Diagnosis not present

## 2022-09-30 ENCOUNTER — Ambulatory Visit: Payer: Medicare PPO | Admitting: Orthopedic Surgery

## 2022-10-16 ENCOUNTER — Ambulatory Visit: Payer: Medicare PPO | Admitting: Orthopedic Surgery

## 2022-10-23 DIAGNOSIS — H6690 Otitis media, unspecified, unspecified ear: Secondary | ICD-10-CM | POA: Diagnosis not present

## 2022-10-23 DIAGNOSIS — R062 Wheezing: Secondary | ICD-10-CM | POA: Diagnosis not present

## 2022-11-04 DIAGNOSIS — I10 Essential (primary) hypertension: Secondary | ICD-10-CM | POA: Diagnosis not present

## 2022-11-04 DIAGNOSIS — R42 Dizziness and giddiness: Secondary | ICD-10-CM | POA: Diagnosis not present

## 2022-11-27 DIAGNOSIS — R059 Cough, unspecified: Secondary | ICD-10-CM | POA: Diagnosis not present

## 2022-11-27 DIAGNOSIS — U071 COVID-19: Secondary | ICD-10-CM | POA: Diagnosis not present

## 2022-11-27 DIAGNOSIS — J209 Acute bronchitis, unspecified: Secondary | ICD-10-CM | POA: Diagnosis not present

## 2022-12-19 DIAGNOSIS — Z833 Family history of diabetes mellitus: Secondary | ICD-10-CM | POA: Diagnosis not present

## 2022-12-19 DIAGNOSIS — E063 Autoimmune thyroiditis: Secondary | ICD-10-CM | POA: Diagnosis not present

## 2022-12-19 DIAGNOSIS — R7303 Prediabetes: Secondary | ICD-10-CM | POA: Diagnosis not present

## 2022-12-19 DIAGNOSIS — R7309 Other abnormal glucose: Secondary | ICD-10-CM | POA: Diagnosis not present

## 2022-12-19 DIAGNOSIS — E039 Hypothyroidism, unspecified: Secondary | ICD-10-CM | POA: Diagnosis not present

## 2023-01-09 ENCOUNTER — Ambulatory Visit: Payer: Medicare PPO | Admitting: Gastroenterology

## 2023-01-09 ENCOUNTER — Telehealth: Payer: Self-pay | Admitting: Gastroenterology

## 2023-01-09 DIAGNOSIS — S42202A Unspecified fracture of upper end of left humerus, initial encounter for closed fracture: Secondary | ICD-10-CM | POA: Diagnosis not present

## 2023-01-09 NOTE — Progress Notes (Deleted)
Wyline Mood MD, MRCP(U.K) [address]  [address]  [place], [place] [address]  Main: [phone]  Fax: [phone]   Primary Care Physician: Ronal Fear, NP (Inactive)  Primary Gastroenterologist:  Dr. Wyline Mood   No chief complaint on file.   HPI: Mercedes Carter is a 67 y.o. female   Summary of history :  Seen last in 11/2022 for bile salt diarrhea.  She has previously been seen at my office  in June 2019 for diarrhea.  History of adenomatous and villous adenomas that were piecemeal resected.  Back in 2019 was seen for diarrhea with a negative celiac serology was treated for IBS.  Repeat colonoscopy in 2020 showed a 5 mm polyp in the ascending colon otherwise was normal.  It was a serrated polyp.  11/09/2020: TSH normal, hepatic function panel normal, BMP normal no recent CBC on file.   She states that she has noted a change in the color of her stool.  Sometimes it is green color sometimes is black in color she has a bit of abdominal discomfort over the upper part of her abdomen worse when she eats going on for many months that she has been taking Motrin 2-3 times a week for over 2 years.  History of chronic nausea.  She is on a keto diet and has lost a lot of weight intentionally.  Not taking any PPI.    Interval history  11/21/2022-01/09/2023   11/21/2022: H pylori breath test negative. Hb 13.2 grams.  12/01/2022; EGD: Normal    ***   Current Outpatient Medications  Medication Sig Dispense Refill   acetaminophen (TYLENOL) 325 MG tablet Take 325 mg by mouth as needed for mild pain.     allopurinol (ZYLOPRIM) 100 MG tablet Take 100 mg by mouth daily.     busPIRone (BUSPAR) 15 MG tablet Take 15 mg by mouth 2 (two) times daily.     Cholecalciferol 1.25 MG (50000 UT) capsule Take 50,000 Units by mouth once a week.     DULoxetine (CYMBALTA) 60 MG capsule Take 60 mg by mouth daily.     escitalopram (LEXAPRO) 10 MG tablet Take 10 mg by mouth daily.     levothyroxine  (SYNTHROID, LEVOTHROID) 150 MCG tablet Take 150 mcg by mouth daily before breakfast.     metoprolol succinate (TOPROL-XL) 50 MG 24 hr tablet Take 1 tablet (50 mg total) by mouth daily. 90 tablet 3   omeprazole (PRILOSEC) 40 MG capsule Take 1 capsule (40 mg total) by mouth daily. 90 capsule 3   No current facility-administered medications for this visit.    Allergies as of 01/09/2023 - Review Complete 08/19/2022  Allergen Reaction Noted   Morphine and codeine Shortness Of Breath 04/10/2017   Tetanus toxoids Anaphylaxis    Crestor [rosuvastatin]  05/08/2022   Hydrocodone-acetaminophen Itching 07/21/2014       Interval history   ***/***/202*   ***/***/2024   ROS:  General: Negative for anorexia, weight loss, fever, chills, fatigue, weakness. ENT: Negative for hoarseness, difficulty swallowing , nasal congestion. CV: Negative for chest pain, angina, palpitations, dyspnea on exertion, peripheral edema.  Respiratory: Negative for dyspnea at rest, dyspnea on exertion, cough, sputum, wheezing.  GI: See history of present illness. GU:  Negative for dysuria, hematuria, urinary incontinence, urinary frequency, nocturnal urination.  Endo: Negative for unusual weight change.    Physical Examination:   There were no vitals taken for this visit.  General: Well-nourished, well-developed in no acute distress.  Eyes: No icterus. Conjunctivae pink. Mouth: Oropharyngeal mucosa moist and pink , no lesions erythema or exudate. Lungs: Clear to auscultation bilaterally. Non-labored. Heart: Regular rate and rhythm, no murmurs rubs or gallops.  Abdomen: Bowel sounds are normal, nontender, nondistended, no hepatosplenomegaly or masses, no abdominal bruits or hernia , no rebound or guarding.   Extremities: No lower extremity edema. No clubbing or deformities. Neuro: Alert and oriented x 3.  Grossly intact. Skin: Warm and dry, no jaundice.   Psych: Alert and cooperative, normal mood and  affect.   Imaging Studies: No results found.  Assessment and Plan:   Mercedes Carter is a 67 y.o. y/o female here to see me for change in the color of her stool.  History suggestive of melena likely secondary to long-term NSAID use.  I have suggested her to stop using NSAIDs.  Commence on Prilosec 40 mg daily to heal any gastritis/ulcer.  I will obtain a CBC today and schedule her for an upper endoscopy.     I have discussed alternative options, risks & benefits,  which include, but are not limited to, bleeding, infection, perforation,respiratory complication & drug reaction.  The patient agrees with this plan & written consent will be obtained.      Dr Wyline Mood  MD,MRCP [facility]) Follow up in   BP check ***

## 2023-01-09 NOTE — Telephone Encounter (Signed)
Patient called back in she stated that the nurse she spoke with informed her that her test came back normal with great results. She has broken her arm and is currently at the doctor and she wants to cancel her appointment.

## 2023-01-16 DIAGNOSIS — M1712 Unilateral primary osteoarthritis, left knee: Secondary | ICD-10-CM | POA: Diagnosis not present

## 2023-01-16 DIAGNOSIS — S42202A Unspecified fracture of upper end of left humerus, initial encounter for closed fracture: Secondary | ICD-10-CM | POA: Diagnosis not present

## 2023-01-28 DIAGNOSIS — S42202A Unspecified fracture of upper end of left humerus, initial encounter for closed fracture: Secondary | ICD-10-CM | POA: Diagnosis not present

## 2023-02-14 DIAGNOSIS — M1712 Unilateral primary osteoarthritis, left knee: Secondary | ICD-10-CM | POA: Diagnosis not present

## 2023-02-24 DIAGNOSIS — Z9181 History of falling: Secondary | ICD-10-CM | POA: Diagnosis not present

## 2023-02-24 DIAGNOSIS — Z1331 Encounter for screening for depression: Secondary | ICD-10-CM | POA: Diagnosis not present

## 2023-02-24 DIAGNOSIS — R1011 Right upper quadrant pain: Secondary | ICD-10-CM | POA: Diagnosis not present

## 2023-02-24 DIAGNOSIS — R1012 Left upper quadrant pain: Secondary | ICD-10-CM | POA: Diagnosis not present

## 2023-03-30 DIAGNOSIS — Z79890 Hormone replacement therapy (postmenopausal): Secondary | ICD-10-CM | POA: Diagnosis not present

## 2023-03-30 DIAGNOSIS — Z86718 Personal history of other venous thrombosis and embolism: Secondary | ICD-10-CM | POA: Diagnosis not present

## 2023-03-30 DIAGNOSIS — R000 Tachycardia, unspecified: Secondary | ICD-10-CM | POA: Diagnosis not present

## 2023-03-30 DIAGNOSIS — R109 Unspecified abdominal pain: Secondary | ICD-10-CM | POA: Diagnosis not present

## 2023-03-30 DIAGNOSIS — E039 Hypothyroidism, unspecified: Secondary | ICD-10-CM | POA: Diagnosis not present

## 2023-03-30 DIAGNOSIS — K769 Liver disease, unspecified: Secondary | ICD-10-CM | POA: Diagnosis not present

## 2023-03-30 DIAGNOSIS — R200 Anesthesia of skin: Secondary | ICD-10-CM | POA: Diagnosis not present

## 2023-03-30 DIAGNOSIS — N3000 Acute cystitis without hematuria: Secondary | ICD-10-CM | POA: Diagnosis not present

## 2023-03-30 DIAGNOSIS — H8109 Meniere's disease, unspecified ear: Secondary | ICD-10-CM

## 2023-03-30 DIAGNOSIS — R1013 Epigastric pain: Secondary | ICD-10-CM | POA: Diagnosis not present

## 2023-03-30 DIAGNOSIS — A0839 Other viral enteritis: Secondary | ICD-10-CM | POA: Diagnosis not present

## 2023-03-30 DIAGNOSIS — G4733 Obstructive sleep apnea (adult) (pediatric): Secondary | ICD-10-CM | POA: Diagnosis not present

## 2023-03-30 DIAGNOSIS — I10 Essential (primary) hypertension: Secondary | ICD-10-CM | POA: Diagnosis not present

## 2023-03-30 HISTORY — DX: Meniere's disease, unspecified ear: H81.09

## 2023-04-19 DIAGNOSIS — A0839 Other viral enteritis: Secondary | ICD-10-CM | POA: Diagnosis not present

## 2023-05-07 DIAGNOSIS — J019 Acute sinusitis, unspecified: Secondary | ICD-10-CM | POA: Diagnosis not present

## 2023-05-07 DIAGNOSIS — B9689 Other specified bacterial agents as the cause of diseases classified elsewhere: Secondary | ICD-10-CM | POA: Diagnosis not present

## 2023-05-07 DIAGNOSIS — R051 Acute cough: Secondary | ICD-10-CM | POA: Diagnosis not present

## 2023-05-07 DIAGNOSIS — Z03818 Encounter for observation for suspected exposure to other biological agents ruled out: Secondary | ICD-10-CM | POA: Diagnosis not present

## 2023-05-07 DIAGNOSIS — J101 Influenza due to other identified influenza virus with other respiratory manifestations: Secondary | ICD-10-CM | POA: Diagnosis not present

## 2023-06-03 DIAGNOSIS — M533 Sacrococcygeal disorders, not elsewhere classified: Secondary | ICD-10-CM | POA: Diagnosis not present

## 2023-06-03 DIAGNOSIS — M545 Low back pain, unspecified: Secondary | ICD-10-CM | POA: Diagnosis not present

## 2023-06-04 ENCOUNTER — Encounter: Payer: Self-pay | Admitting: Internal Medicine

## 2023-06-04 DIAGNOSIS — Z1231 Encounter for screening mammogram for malignant neoplasm of breast: Secondary | ICD-10-CM

## 2023-07-03 ENCOUNTER — Encounter: Payer: Self-pay | Admitting: Nurse Practitioner

## 2023-07-03 ENCOUNTER — Other Ambulatory Visit: Payer: Self-pay | Admitting: Nurse Practitioner

## 2023-07-03 DIAGNOSIS — N644 Mastodynia: Secondary | ICD-10-CM

## 2023-07-03 DIAGNOSIS — N6321 Unspecified lump in the left breast, upper outer quadrant: Secondary | ICD-10-CM

## 2023-07-03 DIAGNOSIS — Z803 Family history of malignant neoplasm of breast: Secondary | ICD-10-CM | POA: Diagnosis not present

## 2023-07-09 ENCOUNTER — Telehealth: Payer: Self-pay | Admitting: Cardiology

## 2023-07-09 MED ORDER — METOPROLOL SUCCINATE ER 50 MG PO TB24: 50.0000 mg | ORAL_TABLET | Freq: Every day | ORAL | 1 refills | Status: DC

## 2023-07-09 MED ORDER — METOPROLOL SUCCINATE ER 50 MG PO TB24: 50.0000 mg | ORAL_TABLET | Freq: Every day | ORAL | 0 refills | Status: DC

## 2023-07-09 NOTE — Telephone Encounter (Signed)
 Medication sent.

## 2023-07-09 NOTE — Telephone Encounter (Signed)
 Pt's medication was resent to pt's pharmacy asking pt to keep upcoming appt in April 2025 with Dr. Tomie China. Confirmation received.

## 2023-07-09 NOTE — Addendum Note (Signed)
 Addended by: Margaret Pyle D on: 07/09/2023 04:51 PM   Modules accepted: Orders

## 2023-07-09 NOTE — Telephone Encounter (Signed)
*  STAT* If patient is at the pharmacy, call can be transferred to refill team.   1. Which medications need to be refilled? (please list name of each medication and dose if known) metoprolol succinate (TOPROL-XL) 50 MG 24 hr tablet   2. Which pharmacy/location (including street and city if local pharmacy) is medication to be sent to?  [id number] [place] [place] - [address] [facility]    3. Do they need a 30 day or 90 day supply? 90  Patient's has appt 4/15

## 2023-07-10 ENCOUNTER — Ambulatory Visit
Admission: RE | Admit: 2023-07-10 | Discharge: 2023-07-10 | Disposition: A | Discharge status: Home / Self Care | Source: Ambulatory Visit | Attending: Nurse Practitioner | Admitting: Nurse Practitioner

## 2023-07-10 DIAGNOSIS — N6321 Unspecified lump in the left breast, upper outer quadrant: Secondary | ICD-10-CM | POA: Diagnosis not present

## 2023-07-10 DIAGNOSIS — N644 Mastodynia: Secondary | ICD-10-CM | POA: Insufficient documentation

## 2023-07-10 DIAGNOSIS — R92313 Mammographic fatty tissue density, bilateral breasts: Secondary | ICD-10-CM | POA: Diagnosis not present

## 2023-07-10 DIAGNOSIS — N6320 Unspecified lump in the left breast, unspecified quadrant: Secondary | ICD-10-CM | POA: Diagnosis not present

## 2023-07-21 ENCOUNTER — Encounter: Payer: Self-pay | Admitting: Cardiology

## 2023-07-22 ENCOUNTER — Ambulatory Visit: Attending: Cardiology | Admitting: Cardiology

## 2023-07-22 ENCOUNTER — Encounter: Payer: Self-pay | Admitting: Cardiology

## 2023-07-22 VITALS — BP 138/92 | HR 98 | Ht 67.0 in | Wt 286.2 lb

## 2023-07-22 DIAGNOSIS — I209 Angina pectoris, unspecified: Secondary | ICD-10-CM

## 2023-07-22 DIAGNOSIS — G4733 Obstructive sleep apnea (adult) (pediatric): Secondary | ICD-10-CM | POA: Diagnosis not present

## 2023-07-22 DIAGNOSIS — I259 Chronic ischemic heart disease, unspecified: Secondary | ICD-10-CM

## 2023-07-22 DIAGNOSIS — I700 Atherosclerosis of aorta: Secondary | ICD-10-CM

## 2023-07-22 DIAGNOSIS — I10 Essential (primary) hypertension: Secondary | ICD-10-CM

## 2023-07-22 DIAGNOSIS — R0609 Other forms of dyspnea: Secondary | ICD-10-CM

## 2023-07-22 DIAGNOSIS — E782 Mixed hyperlipidemia: Secondary | ICD-10-CM

## 2023-07-22 MED ORDER — NITROGLYCERIN 0.4 MG SL SUBL: 0.4000 mg | SUBLINGUAL_TABLET | SUBLINGUAL | 6 refills | Status: AC | PRN

## 2023-07-22 NOTE — Progress Notes (Signed)
 Cardiology Office Note:    Date:  07/22/2023   ID:  Mercedes Carter, DOB 09-Jan-1956, MRN [id number]  PCP:  Jim Motts, NP (Inactive)  Cardiologist:  Nelia Balzarine, MD   Referring MD: No ref. provider found    ASSESSMENT:    1. Primary hypertension   2. Aortic atherosclerosis (HCC)   3. OSA (obstructive sleep apnea)   4. Morbid obesity (HCC)   5. DOE (dyspnea on exertion)   6. Angina pectoris (HCC)   7. Mixed hyperlipidemia    PLAN:    In order of problems listed above:  Angina pectoris: Patient's symptoms are concerning.  She has multiple risk factors for coronary artery disease.  She is very concerned about this.  I discussed various modalities of evaluation and she prefers CT coronary angiography.  We will do this for her.  In the interim if she has some chest pain sublingual nitroglycerin was prescribed.  History as detailed above. Essential hypertension: She is anxious today.  Her blood pressure is elevated.  She tells me that her blood pressure is fine at home.  Lifestyle modification or salt intake she was discussed.  She will keep a track of her blood pressures.  She will send me the record. Mixed dyslipidemia: On lipid-lowering medications followed by primary care. Morbid obesity: Weight reduction stressed diet emphasized and she promises to do better. She will be seen in follow-up appointment after the above test.  She knows to go to the nearest emergency room for any concerning symptoms Patient will be seen in follow-up appointment in 6 months or earlier if the patient has any concerns.    Medication Adjustments/Labs and Tests Ordered: Current medicines are reviewed at length with the patient today.  Concerns regarding medicines are outlined above.  Orders Placed This Encounter  Procedures   EKG 12-Lead   No orders of the defined types were placed in this encounter.    No chief complaint on file.    History of Present Illness:    Mercedes Carter is a  68 y.o. female.  Patient has past medical history of essential hypertension, mixed dyslipidemia, aortic atherosclerosis, obstructive sleep apnea and morbid obesity.  She has no giving history of chest tightness on exertion and also dyspnea on exertion which is worse than usual.  No orthopnea or PND.  At the time of my evaluation, the patient is alert awake oriented and in no distress.  Past Medical History:  Diagnosis Date   Acute respiratory failure with hypoxia (HCC) 02/18/2012   Altered mental state 02/18/2012   Anxiety and depression    Aortic atherosclerosis (HCC) 10/23/2020   Arthritis    Asthma    no problems in a year   Complication of anesthesia 02/18/2012   ALOC  DIFFICULT WAKING UP   Condyloma 04/10/2017   DVT (deep venous thrombosis) (HCC)    Left sided   Excessive appetite    Family history of anesthesia complication    mother had difficulty waking   Family history of colon cancer 04/10/2017   Family history of ovarian cancer 04/10/2017   GERD (gastroesophageal reflux disease)    Hepatic steatosis    Hepatomegaly    History of Meniere's disease    Hypercarbia 02/18/2012   Hyperglycemia    Hyperlipemia    Hyperlipidemia    Hypertension    [facility] PCP- had pt. on HCTZ, but took herself off  2 yrs. 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    Past Surgical History:  Procedure Laterality Date   arthroscopic  knee     right knee   CHOLECYSTECTOMY     COLONOSCOPY WITH PROPOFOL N/A 06/05/2017   Procedure: COLONOSCOPY WITH PROPOFOL;   Surgeon: Luke Salaam, MD;  Location: [facility] ENDOSCOPY;  Service: Gastroenterology;  Laterality: N/A;   COLONOSCOPY WITH PROPOFOL N/A 04/21/2018   Procedure: COLONOSCOPY WITH PROPOFOL;  Surgeon: Luke Salaam, MD;  Location: [facility] ENDOSCOPY;  Service: Gastroenterology;  Laterality: N/A;   ESOPHAGOGASTRODUODENOSCOPY (EGD) WITH PROPOFOL N/A 11/30/2020   Procedure: ESOPHAGOGASTRODUODENOSCOPY (EGD) WITH PROPOFOL;  Surgeon: Luke Salaam, MD;  Location: [facility] ENDOSCOPY;  Service: Gastroenterology;  Laterality: N/A;   JOINT REPLACEMENT     TOTAL KNEE ARTHROPLASTY  02/18/2012   Procedure: TOTAL KNEE ARTHROPLASTY;  Surgeon: Jasmine Mesi, MD;  Location: [facility] OR;  Service: Orthopedics;  Laterality: Right;  Right total knee arthroplasty    Current Medications: Current Meds  Medication Sig   acetaminophen (TYLENOL) 325 MG tablet Take 325 mg by mouth as needed for mild pain.   albuterol (VENTOLIN HFA) 108 (90 Base) MCG/ACT inhaler Inhale 2 puffs into the lungs every 4 (four) hours as needed for wheezing or shortness of breath.   allopurinol (ZYLOPRIM) 100 MG tablet Take 100 mg by mouth daily.   busPIRone (BUSPAR) 15 MG tablet Take 15 mg by mouth 2 (two) times daily.   cefdinir (OMNICEF) 300 MG capsule Take 1 capsule by mouth 2 (two) times daily.   celecoxib (CELEBREX) 200 MG capsule Take 1 tablet by mouth daily.   Cholecalciferol 1.25 MG (50000 UT) capsule Take 50,000 Units by mouth once a week.   DULoxetine (CYMBALTA) 60 MG capsule Take 60 mg by mouth daily.   escitalopram (LEXAPRO) 10 MG tablet Take 10 mg by mouth daily.   hydrochlorothiazide (HYDRODIURIL) 12.5 MG tablet Take 12.5 mg by mouth as needed (FOR BLOOD PRESSURE OVER 150/90).   levothyroxine (SYNTHROID, LEVOTHROID) 150 MCG tablet Take 150 mcg by mouth daily before breakfast.   meclizine (ANTIVERT) 25 MG tablet Take 25 mg by mouth every 8 (eight) hours as needed for dizziness.   metFORMIN (GLUCOPHAGE-XR) 500 MG 24 hr tablet Take 2 tablets by mouth 2  (two) times daily with a meal.   metoprolol succinate (TOPROL-XL) 50 MG 24 hr tablet Take 1 tablet (50 mg total) by mouth daily.   omeprazole (PRILOSEC) 40 MG capsule Take 1 capsule (40 mg total) by mouth daily.   RABEprazole (ACIPHEX) 20 MG tablet Take 20 mg by mouth 2 (two) times daily.   rosuvastatin (CRESTOR) 5 MG tablet Take 5 mg by mouth at bedtime.     Allergies:   Morphine and codeine, Tetanus toxoids, Crestor [rosuvastatin], Hydrocodone-acetaminophen, and Oxycodone   Social History   Socioeconomic History   Marital status: Married    Spouse name: Not on file   Number of children: Not on file   Years of education: Not on file   Highest education level: Not on file  Occupational History   Not on file  Tobacco Use   Smoking status: Never  Smokeless tobacco: Never  Vaping Use   Vaping status: Never Used  Substance and Sexual Activity   Alcohol use: Not Currently    Comment: occ   Drug use: No   Sexual activity: Yes    Birth control/protection: Post-menopausal  Other Topics Concern   Not on file  Social History Narrative   Not on file   Social Drivers of Health   Financial Resource Strain: Medium Risk (05/07/2023)   Received from [facility]   Overall Financial Resource Strain (CARDIA)    Difficulty of Paying Living Expenses: Somewhat hard  Food Insecurity: No Food Insecurity (05/07/2023)   [facility] from [facility]   Hunger Vital Sign    Worried About Running Out of Food in the Last Year: Never true    Ran Out of Food in the Last Year: Never true  Transportation Needs: No Transportation Needs (05/07/2023)   Received from [facility]    In the past 12 months, has lack of transportation kept you from medical appointments or from getting medications?: No    Lack of Transportation (Non-Medical): No  Physical Activity: Not on file  Stress: Not on file  Social Connections: Not on file      Family History: The patient's family history includes Asthma in her mother; Breast cancer in her maternal aunt; Colon cancer in her maternal grandmother and another family member; Diabetes in her maternal aunt, maternal grandmother, and mother; Heart disease in her mother; Ovarian cancer in her mother and sister.  ROS:   Please see the history of present illness.    All other systems reviewed and are negative.  EKGs/Labs/Other Studies Reviewed:    The following studies were reviewed today: .Marland KitchenEKG Interpretation Date/Time:  Tuesday July 22 2023 11:15:56 EDT Ventricular Rate:  98 PR Interval:  142 QRS Duration:  82 QT Interval:  354 QTC Calculation: 451 R Axis:   37  Text Interpretation: Normal sinus rhythm Low voltage QRS Cannot rule out Anterior infarct , age undetermined When compared with ECG of 21-Feb-2012 21:53, Minimal criteria for Anterior infarct are now Present Confirmed by Belva Crome [phone]) on 07/22/2023 11:24:46 AM     Recent Labs: No results found for requested labs within last 365 days.  Recent Lipid Panel    Component Value Date/Time   CHOL 270 (H) 11/09/2020 1147   TRIG 154 (H) 11/09/2020 1147   HDL 47 11/09/2020 1147   CHOLHDL 5.7 (H) 11/09/2020 1147   LDLCALC 194 (H) 11/09/2020 1147    Physical Exam:    VS:  BP (!) 146/102   Pulse 98   Ht 5\' 7"  (1.702 m)   Wt 286 lb 3.2 oz (129.8 kg)   SpO2 93%   BMI 44.83 kg/m     Wt Readings from Last 3 Encounters:  07/22/23 286 lb 3.2 oz (129.8 kg)  08/19/22 280 lb (127 kg)  05/08/22 277 lb (125.6 kg)     GEN: Patient is in no acute distress HEENT: Normal NECK: No JVD; No carotid bruits LYMPHATICS: No lymphadenopathy CARDIAC: Hear sounds regular, 2/6 systolic murmur at the apex. RESPIRATORY:  Clear to auscultation without rales, wheezing or rhonchi  ABDOMEN: Soft, non-tender, non-distended MUSCULOSKELETAL:  No edema; No deformity  SKIN: Warm and dry NEUROLOGIC:  Alert and oriented x  3 PSYCHIATRIC:  Normal affect   Signed, Garwin Brothers, MD  07/22/2023 11:37 AM    [facility]

## 2023-07-22 NOTE — Patient Instructions (Addendum)
 Medication Instructions:  Your physician has recommended you make the following change in your medication:   Use nitroglycerin 1 tablet placed under the tongue at the first sign of chest pain or an angina attack. 1 tablet may be used every 5 minutes as needed, for up to 15 minutes. Do not take more than 3 tablets in 15 minutes. If pain persist call 911 or go to the nearest ED.   *If you need a refill on your cardiac medications before your next appointment, please call your pharmacy*   Lab Work: None ordered If you have labs (blood work) drawn today and your tests are completely normal, you will receive your results only by: MyChart Message (if you have MyChart) OR A paper copy in the mail If you have any lab test that is abnormal or we need to change your treatment, we will call you to review the results.   Testing/Procedures:   Your cardiac CT will be scheduled at one of the below locations:   [facility] [address] [place], [place] [address] [phone]  OR  [facility] [address] [place], [place] [address] [phone]  OR   [facility] [address] [place], [place] [address] [phone]  OR   [facility] [address] [place], [place] [address] [phone]  OR   [facility] and Vascular Tower [address]  [place], [place] [address] Opening August 04, 2023  If scheduled at [facility], please arrive at the [facility] and Children's Entrance (Entrance C2) of [facility] 30 minutes prior to test start time. You can use the FREE valet parking offered at entrance C (encouraged to control the heart rate for the test)  Proceed to the [facility] Radiology Department (first floor) to check-in and test prep.  All radiology patients and guests should use entrance C2 at [facility], accessed from [facility], even  though the hospital's physical address listed is [address].    If scheduled at [facility] or [facility], please arrive 15 mins early for check-in and test prep.  There is spacious parking and easy access to the radiology department from the [facility] Heart and Vascular entrance. Please enter here and check-in with the desk attendant.   If scheduled at [facility], please arrive 30 minutes early for check-in and test prep.  Please follow these instructions carefully (unless otherwise directed):  On the Night Before the Test: Be sure to Drink plenty of water. Do not consume any caffeinated/decaffeinated beverages or chocolate 12 hours prior to your test. Do not take any antihistamines 12 hours prior to your test.   On the Day of the Test: Drink plenty of water until 1 hour prior to the test. Do not eat any food 1 hour prior to test. You may take your regular medications prior to the test.  Take metoprolol (Toprol XL) 100 mg (2 tablets) two hours prior to test. If you take Furosemide/Hydrochlorothiazide, please HOLD on the morning of the test. Patients who wear a continuous glucose monitor MUST remove the device prior to scanning. FEMALES- please wear underwire-free bra if available, avoid dresses & tight clothing      After the Test: Drink plenty of water. After receiving IV contrast, you may experience a mild flushed feeling. This is normal. On occasion, you may experience a mild rash up  to 24 hours after the test. This is not dangerous. If this occurs, you can take Benadryl 25 mg, Zyrtec, Claritin, or Allegra and increase your fluid intake. (Patients taking Tikosyn should avoid Benadryl, and may take Zyrtec, Claritin, or Allegra) If you experience trouble breathing, this can be serious. If it is severe call 911 IMMEDIATELY. If it is mild, please call our office.  We will call to schedule your test 2-4 weeks out  understanding that some insurance companies will need an authorization prior to the service being performed.   For more information and frequently asked questions, please visit our website : [email or web address]  For non-scheduling related questions, please contact the cardiac imaging nurse navigator should you have any questions/concerns: Cardiac Imaging Nurse Navigators Direct Office Dial: [phone]   For scheduling needs, including cancellations and rescheduling, please call [place], [phone].    Follow-Up: At [facility], you and your health needs are our priority.  As part of our continuing mission to provide you with exceptional heart care, we have created designated Provider Care Teams.  These Care Teams include your primary Cardiologist (physician) and Advanced Practice Providers (APPs -  Physician Assistants and Nurse Practitioners) who all work together to provide you with the care you need, when you need it.  We recommend signing up for the patient portal called "MyChart".  Sign up information is provided on this After Visit Summary.  MyChart is used to connect with patients for Virtual Visits (Telemedicine).  Patients are able to view lab/test results, encounter notes, upcoming appointments, etc.  Non-urgent messages can be sent to your provider as well.   To learn more about what you can do with MyChart, go to [email or web address].    Your next appointment:   9 month(s)  The format for your next appointment:   In Person  Provider:   Hillis Lu, MD    Other Instructions none  Important Information About Sugar

## 2023-08-26 ENCOUNTER — Encounter (HOSPITAL_COMMUNITY): Payer: Self-pay

## 2023-08-26 DIAGNOSIS — R399 Unspecified symptoms and signs involving the genitourinary system: Secondary | ICD-10-CM | POA: Diagnosis not present

## 2023-08-26 DIAGNOSIS — S39012A Strain of muscle, fascia and tendon of lower back, initial encounter: Secondary | ICD-10-CM | POA: Diagnosis not present

## 2023-08-26 DIAGNOSIS — I10 Essential (primary) hypertension: Secondary | ICD-10-CM | POA: Diagnosis not present

## 2023-08-26 DIAGNOSIS — R319 Hematuria, unspecified: Secondary | ICD-10-CM | POA: Diagnosis not present

## 2023-08-26 DIAGNOSIS — N390 Urinary tract infection, site not specified: Secondary | ICD-10-CM | POA: Diagnosis not present

## 2023-08-26 DIAGNOSIS — R609 Edema, unspecified: Secondary | ICD-10-CM | POA: Diagnosis not present

## 2023-08-27 ENCOUNTER — Telehealth (HOSPITAL_COMMUNITY): Payer: Self-pay | Admitting: *Deleted

## 2023-08-27 NOTE — Telephone Encounter (Signed)
 Attempted to call patient regarding upcoming cardiac CT appointment. Left message on voicemail with name and callback number Johney Frame RN Navigator Cardiac Imaging [facility] [phone] Office

## 2023-08-28 ENCOUNTER — Ambulatory Visit (HOSPITAL_COMMUNITY)
Admission: RE | Admit: 2023-08-28 | Discharge: 2023-08-28 | Disposition: A | Discharge status: Home / Self Care | Source: Ambulatory Visit | Attending: Cardiology | Admitting: Cardiology

## 2023-08-28 DIAGNOSIS — I259 Chronic ischemic heart disease, unspecified: Secondary | ICD-10-CM | POA: Insufficient documentation

## 2023-08-28 DIAGNOSIS — I2510 Atherosclerotic heart disease of native coronary artery without angina pectoris: Secondary | ICD-10-CM

## 2023-08-28 MED ORDER — IOHEXOL 350 MG/ML SOLN
100.0000 mL | Freq: Once | INTRAVENOUS | Status: AC | PRN
  Administered 2023-08-28: 100 mL via INTRAVENOUS

## 2023-08-28 MED ORDER — DILTIAZEM HCL 25 MG/5ML IV SOLN: 10.0000 mg | INTRAVENOUS | Status: DC | PRN

## 2023-08-28 MED ORDER — METOPROLOL TARTRATE 5 MG/5ML IV SOLN: 10.0000 mg | Freq: Once | INTRAVENOUS | Status: DC | PRN

## 2023-08-28 MED ORDER — NITROGLYCERIN 0.4 MG SL SUBL
0.8000 mg | SUBLINGUAL_TABLET | Freq: Once | SUBLINGUAL | Status: AC
  Administered 2023-08-28: 0.8 mg via SUBLINGUAL

## 2023-08-29 ENCOUNTER — Ambulatory Visit (HOSPITAL_COMMUNITY)
Admission: RE | Admit: 2023-08-29 | Discharge: 2023-08-29 | Disposition: A | Discharge status: Home / Self Care | Source: Ambulatory Visit | Attending: Internal Medicine | Admitting: Internal Medicine

## 2023-08-29 ENCOUNTER — Other Ambulatory Visit: Payer: Self-pay | Admitting: Internal Medicine

## 2023-08-29 DIAGNOSIS — R931 Abnormal findings on diagnostic imaging of heart and coronary circulation: Secondary | ICD-10-CM | POA: Diagnosis not present

## 2023-09-08 ENCOUNTER — Ambulatory Visit: Payer: Self-pay | Admitting: Cardiology

## 2023-10-28 ENCOUNTER — Telehealth: Payer: Self-pay | Admitting: Cardiology

## 2023-10-28 ENCOUNTER — Other Ambulatory Visit: Payer: Self-pay | Admitting: Cardiology

## 2023-10-28 MED ORDER — METOPROLOL SUCCINATE ER 50 MG PO TB24
50.0000 mg | ORAL_TABLET | Freq: Every day | ORAL | 2 refills | Status: AC
Start: 2023-10-28 — End: ?

## 2023-10-28 NOTE — Telephone Encounter (Signed)
 Pt's medication was sent to pt's pharmacy as requested. Confirmation received.

## 2023-10-28 NOTE — Telephone Encounter (Signed)
*  STAT* If patient is at the pharmacy, call can be transferred to refill team.   1. Which medications need to be refilled? (please list name of each medication and dose if known) metoprolol  succinate (TOPROL -XL) 50 MG 24 hr tablet  2. Which pharmacy/location (including street and city if local pharmacy) is medication to be sent to? [id number] [place],  - [address] [facility]    3. Do they need a 30 day or 90 day supply?  90 day supply  Patient says she has been out of medication for 3 days.

## 2023-10-29 DIAGNOSIS — R5383 Other fatigue: Secondary | ICD-10-CM | POA: Diagnosis not present

## 2023-10-29 DIAGNOSIS — J069 Acute upper respiratory infection, unspecified: Secondary | ICD-10-CM | POA: Diagnosis not present

## 2023-10-29 DIAGNOSIS — R110 Nausea: Secondary | ICD-10-CM | POA: Diagnosis not present

## 2023-10-29 DIAGNOSIS — R7989 Other specified abnormal findings of blood chemistry: Secondary | ICD-10-CM | POA: Diagnosis not present

## 2023-10-29 DIAGNOSIS — R059 Cough, unspecified: Secondary | ICD-10-CM | POA: Diagnosis not present

## 2023-10-29 DIAGNOSIS — R6889 Other general symptoms and signs: Secondary | ICD-10-CM | POA: Diagnosis not present

## 2023-10-29 DIAGNOSIS — R5381 Other malaise: Secondary | ICD-10-CM | POA: Diagnosis not present

## 2023-10-30 DIAGNOSIS — J069 Acute upper respiratory infection, unspecified: Secondary | ICD-10-CM | POA: Diagnosis not present

## 2023-10-30 DIAGNOSIS — R059 Cough, unspecified: Secondary | ICD-10-CM | POA: Diagnosis not present

## 2023-11-06 DIAGNOSIS — J3489 Other specified disorders of nose and nasal sinuses: Secondary | ICD-10-CM | POA: Diagnosis not present

## 2023-11-06 DIAGNOSIS — J45909 Unspecified asthma, uncomplicated: Secondary | ICD-10-CM | POA: Diagnosis not present

## 2023-11-06 DIAGNOSIS — R053 Chronic cough: Secondary | ICD-10-CM | POA: Diagnosis not present

## 2023-11-06 DIAGNOSIS — Z139 Encounter for screening, unspecified: Secondary | ICD-10-CM | POA: Diagnosis not present

## 2024-01-06 DIAGNOSIS — R0600 Dyspnea, unspecified: Secondary | ICD-10-CM | POA: Diagnosis not present

## 2024-01-06 DIAGNOSIS — G4730 Sleep apnea, unspecified: Secondary | ICD-10-CM | POA: Diagnosis not present

## 2024-01-06 DIAGNOSIS — Z1159 Encounter for screening for other viral diseases: Secondary | ICD-10-CM | POA: Diagnosis not present

## 2024-01-06 DIAGNOSIS — R319 Hematuria, unspecified: Secondary | ICD-10-CM | POA: Diagnosis not present

## 2024-01-06 DIAGNOSIS — E7800 Pure hypercholesterolemia, unspecified: Secondary | ICD-10-CM | POA: Diagnosis not present

## 2024-01-06 DIAGNOSIS — E039 Hypothyroidism, unspecified: Secondary | ICD-10-CM | POA: Diagnosis not present

## 2024-01-06 DIAGNOSIS — I10 Essential (primary) hypertension: Secondary | ICD-10-CM | POA: Diagnosis not present

## 2024-01-06 DIAGNOSIS — E669 Obesity, unspecified: Secondary | ICD-10-CM | POA: Diagnosis not present

## 2024-01-06 DIAGNOSIS — R519 Headache, unspecified: Secondary | ICD-10-CM | POA: Diagnosis not present

## 2024-01-06 DIAGNOSIS — I2510 Atherosclerotic heart disease of native coronary artery without angina pectoris: Secondary | ICD-10-CM | POA: Diagnosis not present

## 2024-01-06 DIAGNOSIS — R202 Paresthesia of skin: Secondary | ICD-10-CM | POA: Diagnosis not present

## 2024-01-06 DIAGNOSIS — Z6841 Body Mass Index (BMI) 40.0 and over, adult: Secondary | ICD-10-CM | POA: Diagnosis not present

## 2024-01-06 DIAGNOSIS — R55 Syncope and collapse: Secondary | ICD-10-CM | POA: Diagnosis not present

## 2024-01-06 DIAGNOSIS — R1310 Dysphagia, unspecified: Secondary | ICD-10-CM | POA: Diagnosis not present

## 2024-01-06 DIAGNOSIS — M2550 Pain in unspecified joint: Secondary | ICD-10-CM | POA: Diagnosis not present

## 2024-01-20 DIAGNOSIS — E538 Deficiency of other specified B group vitamins: Secondary | ICD-10-CM | POA: Diagnosis not present

## 2024-01-20 DIAGNOSIS — N189 Chronic kidney disease, unspecified: Secondary | ICD-10-CM | POA: Diagnosis not present

## 2024-01-20 DIAGNOSIS — R0609 Other forms of dyspnea: Secondary | ICD-10-CM | POA: Diagnosis not present

## 2024-01-20 DIAGNOSIS — I129 Hypertensive chronic kidney disease with stage 1 through stage 4 chronic kidney disease, or unspecified chronic kidney disease: Secondary | ICD-10-CM | POA: Diagnosis not present

## 2024-01-20 DIAGNOSIS — I2510 Atherosclerotic heart disease of native coronary artery without angina pectoris: Secondary | ICD-10-CM | POA: Diagnosis not present

## 2024-01-20 DIAGNOSIS — R519 Headache, unspecified: Secondary | ICD-10-CM | POA: Diagnosis not present

## 2024-01-20 DIAGNOSIS — E039 Hypothyroidism, unspecified: Secondary | ICD-10-CM | POA: Diagnosis not present

## 2024-01-20 DIAGNOSIS — R0600 Dyspnea, unspecified: Secondary | ICD-10-CM | POA: Diagnosis not present

## 2024-01-20 DIAGNOSIS — R42 Dizziness and giddiness: Secondary | ICD-10-CM | POA: Diagnosis not present

## 2024-01-20 DIAGNOSIS — I10 Essential (primary) hypertension: Secondary | ICD-10-CM | POA: Diagnosis not present

## 2024-01-21 DIAGNOSIS — E785 Hyperlipidemia, unspecified: Secondary | ICD-10-CM | POA: Diagnosis not present

## 2024-01-21 DIAGNOSIS — I10 Essential (primary) hypertension: Secondary | ICD-10-CM | POA: Diagnosis not present

## 2024-01-21 DIAGNOSIS — I2510 Atherosclerotic heart disease of native coronary artery without angina pectoris: Secondary | ICD-10-CM | POA: Diagnosis not present

## 2024-01-21 DIAGNOSIS — R0609 Other forms of dyspnea: Secondary | ICD-10-CM | POA: Diagnosis not present

## 2024-01-28 DIAGNOSIS — Z87898 Personal history of other specified conditions: Secondary | ICD-10-CM | POA: Diagnosis not present

## 2024-01-28 DIAGNOSIS — R0609 Other forms of dyspnea: Secondary | ICD-10-CM | POA: Diagnosis not present

## 2024-01-28 DIAGNOSIS — R5383 Other fatigue: Secondary | ICD-10-CM | POA: Diagnosis not present

## 2024-02-18 DIAGNOSIS — R109 Unspecified abdominal pain: Secondary | ICD-10-CM | POA: Diagnosis not present

## 2024-02-18 DIAGNOSIS — R300 Dysuria: Secondary | ICD-10-CM | POA: Diagnosis not present

## 2024-02-18 DIAGNOSIS — R1110 Vomiting, unspecified: Secondary | ICD-10-CM | POA: Diagnosis not present

## 2024-02-18 DIAGNOSIS — N390 Urinary tract infection, site not specified: Secondary | ICD-10-CM | POA: Diagnosis not present

## 2024-02-18 DIAGNOSIS — M545 Low back pain, unspecified: Secondary | ICD-10-CM | POA: Diagnosis not present

## 2024-02-19 DIAGNOSIS — I471 Supraventricular tachycardia, unspecified: Secondary | ICD-10-CM | POA: Diagnosis not present

## 2024-02-26 DIAGNOSIS — R0609 Other forms of dyspnea: Secondary | ICD-10-CM | POA: Diagnosis not present
